# Patient Record
Sex: Male | Born: 1945 | Race: White | Hispanic: No | Marital: Married | Smoking: Current every day smoker
Health system: Southern US, Community
[De-identification: ages and names within clinical notes are randomized; demographics above are authoritative.]

## PROBLEM LIST (undated history)

## (undated) DIAGNOSIS — I4510 Unspecified right bundle-branch block: Secondary | ICD-10-CM

## (undated) DIAGNOSIS — E785 Hyperlipidemia, unspecified: Secondary | ICD-10-CM

## (undated) DIAGNOSIS — I200 Unstable angina: Secondary | ICD-10-CM

## (undated) DIAGNOSIS — I10 Essential (primary) hypertension: Secondary | ICD-10-CM

## (undated) DIAGNOSIS — I6529 Occlusion and stenosis of unspecified carotid artery: Secondary | ICD-10-CM

## (undated) DIAGNOSIS — J449 Chronic obstructive pulmonary disease, unspecified: Secondary | ICD-10-CM

## (undated) HISTORY — DX: Occlusion and stenosis of unspecified carotid artery: I65.29

## (undated) HISTORY — DX: Chronic obstructive pulmonary disease, unspecified: J44.9

## (undated) HISTORY — DX: Hyperlipidemia, unspecified: E78.5

## (undated) HISTORY — PX: HERNIA REPAIR: SHX51

## (undated) HISTORY — DX: Essential (primary) hypertension: I10

---

## 1980-02-16 HISTORY — PX: SPINE SURGERY: SHX786

## 2006-02-15 HISTORY — PX: INGUINAL HERNIA REPAIR: SUR1180

## 2007-08-10 ENCOUNTER — Ambulatory Visit: Payer: Self-pay | Admitting: *Deleted

## 2008-08-09 ENCOUNTER — Ambulatory Visit: Payer: Self-pay | Admitting: Gastroenterology

## 2008-08-30 ENCOUNTER — Ambulatory Visit: Payer: Self-pay | Admitting: Gastroenterology

## 2008-10-22 ENCOUNTER — Ambulatory Visit (HOSPITAL_COMMUNITY): Admission: RE | Admit: 2008-10-22 | Discharge: 2008-10-22 | Payer: Self-pay | Admitting: Surgery

## 2008-10-31 ENCOUNTER — Ambulatory Visit: Payer: Self-pay | Admitting: Vascular Surgery

## 2009-05-08 ENCOUNTER — Ambulatory Visit: Payer: Self-pay | Admitting: Vascular Surgery

## 2009-11-07 ENCOUNTER — Ambulatory Visit: Payer: Self-pay | Admitting: Vascular Surgery

## 2010-05-22 LAB — DIFFERENTIAL
Basophils Absolute: 0 K/uL (ref 0.0–0.1)
Basophils Relative: 1 % (ref 0–1)
Eosinophils Absolute: 0.1 K/uL (ref 0.0–0.7)
Eosinophils Relative: 1 % (ref 0–5)
Lymphocytes Relative: 27 % (ref 12–46)
Lymphs Abs: 2.2 K/uL (ref 0.7–4.0)
Monocytes Absolute: 0.8 K/uL (ref 0.1–1.0)
Monocytes Relative: 10 % (ref 3–12)
Neutro Abs: 5.1 K/uL (ref 1.7–7.7)
Neutrophils Relative %: 61 % (ref 43–77)

## 2010-05-22 LAB — BASIC METABOLIC PANEL WITH GFR
BUN: 19 mg/dL (ref 6–23)
CO2: 28 meq/L (ref 19–32)
Calcium: 10.1 mg/dL (ref 8.4–10.5)
Chloride: 106 meq/L (ref 96–112)
Creatinine, Ser: 1.73 mg/dL — ABNORMAL HIGH (ref 0.4–1.5)
GFR calc non Af Amer: 40 mL/min — ABNORMAL LOW
Glucose, Bld: 111 mg/dL — ABNORMAL HIGH (ref 70–99)
Potassium: 5.8 meq/L — ABNORMAL HIGH (ref 3.5–5.1)
Sodium: 142 meq/L (ref 135–145)

## 2010-05-22 LAB — CBC
HCT: 40.7 % (ref 39.0–52.0)
Hemoglobin: 14 g/dL (ref 13.0–17.0)
MCHC: 34.5 g/dL (ref 30.0–36.0)
MCV: 98.5 fL (ref 78.0–100.0)
Platelets: 163 K/uL (ref 150–400)
RBC: 4.13 MIL/uL — ABNORMAL LOW (ref 4.22–5.81)
RDW: 13.1 % (ref 11.5–15.5)
WBC: 8.2 K/uL (ref 4.0–10.5)

## 2010-06-30 NOTE — Procedures (Signed)
CAROTID DUPLEX EXAM   INDICATION:  Followup of known carotid artery disease with 40%-59%  stenosis in the left ICA on previous exam of 05/08/2009.   HISTORY:  Diabetes:  No.  Cardiac:  No.  Hypertension:  Yes.  Smoking:  Yes.  Previous Surgery:  No.  CV History:  Amaurosis Fugax No, Paresthesias No, Hemiparesis No                                       RIGHT             LEFT  Brachial systolic pressure:         130               136  Brachial Doppler waveforms:         Triphasic         Triphasic  Vertebral direction of flow:        Antegrade         Antegrade  DUPLEX VELOCITIES (cm/sec)  CCA peak systolic                   118               118  ECA peak systolic                   124               122  ICA peak systolic                   81                103  ICA end diastolic                   20                24  PLAQUE MORPHOLOGY:                                    Calcific  PLAQUE AMOUNT:                      None              Mild  PLAQUE LOCATION:                                      Bifurcation   IMPRESSION:  1. No hemodynamically significant right internal carotid artery      stenosis, <40%.  2. No hemodynamically significant left internal carotid artery      stenosis, <40%.  3. Stable velocities in internal carotid arteries compared to previous      study of 05/08/2009.   ___________________________________________  Di Kindle. Edilia Bo, M.D.   RS/MEDQ  D:  11/07/2009  T:  11/08/2009  Job:  [id number]

## 2010-06-30 NOTE — Procedures (Signed)
CAROTID DUPLEX EXAM   INDICATION:  Follow-up carotid artery disease.   HISTORY:  Diabetes:  No.  Cardiac:  No.  Hypertension:  Yes.  Smoking:  Yes.  Previous Surgery:  No.  CV History:  No.  Amaurosis Fugax No, Paresthesias No, Hemiparesis No                                       RIGHT             LEFT  Brachial systolic pressure:         142               144  Brachial Doppler waveforms:         WNL               WNL  Vertebral direction of flow:        Antegrade         Antegrade  DUPLEX VELOCITIES (cm/sec)  CCA peak systolic                   153               180  ECA peak systolic                   194               190  ICA peak systolic                   119               161  ICA end diastolic                   32                44  PLAQUE MORPHOLOGY:                  Calcific          Calcific  PLAQUE AMOUNT:                      Mild/moderate     Moderate  PLAQUE LOCATION:                    ECA/bifurcation  ICA/ECA/bifurcation   IMPRESSION:  1. Right internal carotid artery shows evidence of 40% to 59% stenosis      (low end of range) with minimal plaque visualized in the internal      carotid artery.  2. Left internal carotid artery shows evidence of 40% to 59% stenosis.  3. Bilateral external carotid artery stenosis.   ___________________________________________  Di Kindle. Edilia Bo, M.D.   AS/MEDQ  D:  10/31/2008  T:  10/31/2008  Job:  [id number]

## 2010-06-30 NOTE — Assessment & Plan Note (Signed)
OFFICE VISIT   Alex Mullins, Alex Mullins  DOB:  Aug 04, 1945                                       10/31/2008  CHART#:[id number]   I saw the patient in the office today for continued follow-up of his  carotid disease.  He had been seen in consultation by Dr. Madilyn Fireman in June  2009 with bilateral carotid disease.  At that time he had moderate  asymptomatic left internal carotid artery stenosis and Dr. Madilyn Fireman simply  discussed tobacco cessation with him and he has been on aspirin.  He was  then lost to follow-up from our group but Dr. Tania Ade set him up for a  follow-up duplex scan.  Since he was seen last he denies any history of  stroke, TIAs, expressive or receptive aphasia or amaurosis fugax.   He does admit to bilateral lower extremity claudication which really had  not been apparent to him until he had a stress test approximately 3  weeks ago and had to stop because of pain in his legs.  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  He  denies any history of rest pain, although he does get cramps in his feet  at night.  He has had no history of nonhealing ulcers.  His symptoms in  his calves are brought on by ambulation and relieved with rest..   PAST MEDICAL HISTORY:  Is significant for hypertension,  hypercholesterolemia, COPD.  He denies any history of diabetes, previous  myocardial infarction or history of congestive heart failure.   FAMILY HISTORY:  He is unaware of any history of premature  cardiovascular disease.   SOCIAL HISTORY:  He is married and he has 3 children.  He works as a  [employer], he had smoked 5-6/2 packs per day of cigarettes but has  cut back to 1-1/2 packs per day.  Review of systems, medications are  documented on the medical history form in his chart.   PHYSICAL EXAMINATION:  This is a pleasant 65 year old gentleman who  appears his stated age.  Blood pressure is 148/67, heart rate is 69.  HEENT:   Is unremarkable.  I do not detect any carotid bruits.  Lungs:  Are clear bilaterally to auscultation.  On cardiac exam, he has a  regular rate and rhythm.  Abdomen:  Soft and nontender, I cannot palpate  an aneurysm.  He has palpable femoral pulses, I cannot palpate pedal  pulses, both feet appear adequately perfused without ischemic ulcers.  He has no significant lower extremity swelling.  Neurologic Exam:  Is  nonfocal.   Doppler study in our office today shows biphasic Doppler signals in the  right foot with an ABI of 92%.  On the left side he has monophasic  signals with an ABI of 58%.   His carotid duplex scan shows bilateral 40% to 59% carotid stenoses.  Vertebral arteries are patent with normally-directed flow.   With respect to his peripheral vascular disease, I have encouraged him  to quit smoking and he is working on this.  We have also discussed the  importance of a structured walking program.  With respect to his carotid  disease, he understands we would generally not consider carotid  endarterectomy in an asymptomatic patient unless the stenosis progressed  to greater than 80%.  I plan  on seeing him back in 6 months with a  follow-up duplex scan.  In the meantime, he knows to continue taking his  aspirin.  He knows to call sooner if he has problems.   Di Kindle. Edilia Bo, M.D.  Electronically Signed   CSD/MEDQ  D:  10/31/2008  T:  11/01/2008  Job:  [id number]   cc:   Belva Agee, Dr.

## 2010-06-30 NOTE — Consult Note (Signed)
VASCULAR SURGERY CONSULTATION   Alex Mullins, Alex Mullins  DOB:  10-09-1945                                       08/10/2007  UJWJX#:[id number]   REFERRING PHYSICIAN:  Belva Agee, MD   REASON FOR CONSULTATION:  Bilateral carotid occlusive disease.   HISTORY:  The patient is a 65 year old gentleman recently evaluated by  Dr. Tania Ade at [facility].  He was complaining of  some ringing in his ears at that time.  He was also noted to have  possible bilateral carotid bruits.  He was therefore sent for carotid  Doppler evaluation.  This was carried out at [facility].  He has  a 50-69% proximal left internal carotid artery stenosis and a less than  50% proximal right internal carotid artery stenosis.  Bilateral carotid  plaque is noted.  Antegrade bilateral vertebral flow.   The patient denies a history of stroke.  No sensory, motor or visual  deficit.  He does have ringing in his ears.  No unsteadiness of gait.  Denies dizziness.  No syncope.   PAST MEDICAL HISTORY:  1. Hypertension.  2. Hyperlipidemia.  3. COPD.  4. Vitamin D deficiency.  5. Tobacco abuse.   MEDICATIONS:  1. Simvastatin 40 mg daily.  2. Fish oil 1000 international units t.i.d.  3. Vitamin D 5000 international units weekly.  4. Benicar 40/25 daily.   FAMILY HISTORY:  Mother died age 27, no history of heart disease or  stroke.  Father died age 61 with a history of heart disease, died of an  MI.   SOCIAL HISTORY:  The patient is married, three children.  Works as a  [employer].  Smokes 2-1/2 packs of cigarettes daily.  Has an  occasional alcoholic beverage.   REVIEW OF SYSTEMS:  Refer to patient encounter form.  The patient notes  some cramps in his legs at night.  Pain and discomfort in his legs with  ambulation.  Other questions otherwise negative.   PHYSICAL EXAMINATION:  General:  Generally a well-appearing 65 year old  male.  Alert and oriented.  In no acute  distress.  Vital signs:  BP  130/76, pulse is 70 per minute.  Respirations are 16 per minute.  Neck:  Supple.  No thyromegaly or adenopathy.  Chest:  Equal air entry  bilaterally without rales or rhonchi.  Cardiovascular:  Normal heart  sounds without murmurs.  No carotid bruits.  Regular rate and rhythm.  No gallops or rubs.  Abdomen:  Soft, nontender.  No masses or  organomegaly.  Neurological:  Cranial nerves intact.  Strength equal  bilaterally.  1+ reflexes bilaterally.   IMPRESSION:  1. Moderate asymptomatic left internal carotid artery stenosis.  2. Hypertension.  3. Hyperlipidemia.  4. Chronic obstructive pulmonary disease.  5. Tobacco abuse.   RECOMMENDATIONS:  The patient counseled regarding smoking cessation.  Begin aspirin 325 mg daily.  Follow up in 6 months with a carotid  Doppler evaluation.   Balinda Quails, M.D.  Electronically Signed  PGH/MEDQ  D:  08/10/2007  T:  08/11/2007  Job:  [id number]

## 2010-06-30 NOTE — Procedures (Signed)
CAROTID DUPLEX EXAM   INDICATION:  Follow-up carotid artery disease.   HISTORY:  Diabetes:  No  Cardiac:  No  Hypertension:  Yes  Smoking:  Yes  Previous Surgery:  No  CV History:  No  Amaurosis Fugax No, Paresthesias No, Hemiparesis No                                       RIGHT             LEFT  Brachial systolic pressure:         126               125  Brachial Doppler waveforms:         WNL               WNL  Vertebral direction of flow:        Antegrade         Antegrade  DUPLEX VELOCITIES (cm/sec)  CCA peak systolic                   132               118  ECA peak systolic                   137               128  ICA peak systolic                   88                111  ICA end diastolic                   23                34  PLAQUE MORPHOLOGY:                  Calcific  Calcific/heterogeneous  PLAQUE AMOUNT:                      Mild              Mild  PLAQUE LOCATION:                    ICA, ECA          CCA, ICA   IMPRESSION:  1. Right internal carotid artery suggests 20%-39% stenosis which is      slightly lower than previous study.  2. Left internal carotid artery suggests 40%-59% stenosis.  3. Antegrade flow in bilateral vertebrals.   ___________________________________________  Di Kindle. Edilia Bo, M.D.   CB/MEDQ  D:  05/09/2009  T:  05/09/2009  Job:  [id number]

## 2010-10-07 ENCOUNTER — Encounter: Payer: Self-pay | Admitting: Vascular Surgery

## 2010-11-10 ENCOUNTER — Encounter: Payer: Self-pay | Admitting: Vascular Surgery

## 2010-11-11 ENCOUNTER — Encounter: Payer: Self-pay | Admitting: Vascular Surgery

## 2010-11-11 ENCOUNTER — Ambulatory Visit (INDEPENDENT_AMBULATORY_CARE_PROVIDER_SITE_OTHER): Payer: Medicare Other | Admitting: Vascular Surgery

## 2010-11-11 ENCOUNTER — Other Ambulatory Visit (INDEPENDENT_AMBULATORY_CARE_PROVIDER_SITE_OTHER): Payer: Medicare Other | Admitting: *Deleted

## 2010-11-11 VITALS — BP 165/69 | HR 44 | Resp 16 | Ht 72.0 in | Wt 180.3 lb

## 2010-11-11 DIAGNOSIS — I6529 Occlusion and stenosis of unspecified carotid artery: Secondary | ICD-10-CM

## 2010-11-11 NOTE — Progress Notes (Signed)
CC: followup of carotid disease  HPI: Alex Mullins is a 65 y.o. male who I last saw in September of 2010 with bilateral 40-59% carotid stenoses. 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 He denies any history of stroke, TIAs, expressive or receptive aphasia, or amaurosis fugax. He has a history of hypertension and hyperlipidemia both of which are stable on his current medications. He also has a history of COPD but has had no recent exacerbations.  Past Medical History  Diagnosis Date  . COPD (chronic obstructive pulmonary disease)   . Hyperlipidemia   . Hypertension   . Vitamin D deficiency   . Carotid artery occlusion     FAMILY HISTORY: Family History  Problem Relation Age of Onset  . Heart disease Father   . Heart attack Father     SOCIAL HISTORY: History  Substance Use Topics  . Smoking status: Current Everyday Smoker -- 2.5 packs/day for 50 years    Types: Cigarettes  . Smokeless tobacco: Not on file  . Alcohol Use: No     occasionally    No Known Allergies  Current Outpatient Prescriptions  Medication Sig Dispense Refill  . Cholecalciferol (VITAMIN D3) 5000 UNITS CAPS Take 5,000 Units by mouth once a week.        . fish oil-omega-3 fatty acids 1000 MG capsule Take 2 g by mouth 3 (three) times daily.        Marland Kitchen olmesartan-hydrochlorothiazide (BENICAR HCT) 40-25 MG per tablet Take 1 tablet by mouth daily.        . simvastatin (ZOCOR) 40 MG tablet Take 40 mg by mouth at bedtime.          REVIEW OF SYSTEMS: CARDIOVASCULAR:  [ ]  chest pain   [ ]  chest pressure   [ ]  palpitations   [ ]  orthopnea   [ ]  dyspnea on exertion   [ ]  claudication   [ ]  rest pain   [ ]  DVT   [ ]  phlebitis PULMONARY:   [ ]  productive cough   [ ]  asthma   [ ]  wheezing NEUROLOGIC:   [ ]  weakness  [ ]  paresthesias  [ ]  aphasia  [ ]  amaurosis  [ ]  dizziness HEMATOLOGIC:   [ ]  bleeding  problems   [ ]  clotting disorders MUSCULOSKELETAL:  [ ]  joint pain   [ ]  joint swelling GASTROINTESTINAL: [ ]   blood in stool  [ ]   hematemesis GENITOURINARY:  [ ]   dysuria  [ ]   hematuria PSYCHIATRIC:  [ ]  history of major depression INTEGUMENTARY:  [ ]  rashes  [ ]  ulcers CONSTITUTIONAL:  [ ]  fever   [ ]  chills  PHYSICAL EXAM: Filed Vitals:   11/11/10 1059 11/11/10 1100  BP: 170/69 165/69  Pulse: 44   Resp: 16   Height: 6' (1.829 m)   Weight: 180 lb 4.8 oz (81.784 kg)    Body mass index is 24.45 kg/(m^2).  GENERAL: The patient appears their stated age. The vital signs are documented above. CARDIOVASCULAR: There is a regular rate and rhythm without significant murmur appreciated. I do not detect any carotid bruits. He has palpable femoral pulses and warm well-perfused feet. PULMONARY: There is good air exchange bilaterally without wheezing or rales. ABDOMEN: Soft and non-tender with normal pitched bowel sounds. I do not palpate an abdominal aortic aneurysm. MUSCULOSKELETAL: There are no major deformities or  cyanosis. NEUROLOGIC: No focal weakness or paresthesias are detected. SKIN: There are no ulcers or rashes noted. PSYCHIATRIC: The patient has a normal affect.  DATA: I have independently interpreted his carotid duplex scan which shows no significant carotid stenosis on either side. The velocities have improved compared to the study in September of 2010.   MEDICAL ISSUES: I reassured him that he has minimal carotid disease. Have discussed the importance of tobacco cessation. I have ordered a followup carotid duplex scan in 18 months and I will see him back at that time. He knows to call sooner if he has problems.

## 2010-11-19 NOTE — Procedures (Unsigned)
CAROTID DUPLEX EXAM  INDICATION:  Follow up carotid disease.  HISTORY: Diabetes:  No. Cardiac:  No. Hypertension:  Yes. Smoking:  Yes. Previous Surgery:  No. CV History:  Currently asymptomatic. Amaurosis Fugax No, Paresthesias No, Hemiparesis No.                                      RIGHT             LEFT Brachial systolic pressure:         133               130 Brachial Doppler waveforms:         Normal            Normal Vertebral direction of flow:        Antegrade         Antegrade DUPLEX VELOCITIES (cm/sec) CCA peak systolic                   103               108 ECA peak systolic                   126               136 ICA peak systolic                   77                96 ICA end diastolic                   19                22 PLAQUE MORPHOLOGY:                                    Calcific PLAQUE AMOUNT:                      None              Mild PLAQUE LOCATION:                                      Bifurcation   IMPRESSION: 1. No hemodynamically significant stenosis of the right or left     internal carotid artery. 2. Stable in comparison to the previous examination.        ___________________________________________ Di Kindle. Edilia Bo, M.D.  EM/MEDQ  D:  11/12/2010  T:  11/12/2010  Job:  [id number]

## 2012-05-10 ENCOUNTER — Other Ambulatory Visit: Payer: Medicare Other

## 2012-05-10 ENCOUNTER — Ambulatory Visit: Payer: Medicare Other | Admitting: Neurosurgery

## 2012-06-13 ENCOUNTER — Encounter: Payer: Self-pay | Admitting: Vascular Surgery

## 2012-06-14 ENCOUNTER — Ambulatory Visit (INDEPENDENT_AMBULATORY_CARE_PROVIDER_SITE_OTHER): Payer: Medicare Other | Admitting: Vascular Surgery

## 2012-06-14 ENCOUNTER — Encounter: Payer: Self-pay | Admitting: Vascular Surgery

## 2012-06-14 ENCOUNTER — Other Ambulatory Visit (INDEPENDENT_AMBULATORY_CARE_PROVIDER_SITE_OTHER): Payer: Medicare Other | Admitting: *Deleted

## 2012-06-14 DIAGNOSIS — I6529 Occlusion and stenosis of unspecified carotid artery: Secondary | ICD-10-CM

## 2012-06-14 NOTE — Addendum Note (Signed)
Addended by: Adria Dill L on: 06/14/2012 02:52 PM   Modules accepted: Orders

## 2012-06-14 NOTE — Progress Notes (Signed)
[facility] [place]  Established Carotid Patient  History of Present Illness  Alex Mullins is a 67 y.o. (September 17, 1945) male who presents with no chief complaint.  He is here for his annul follow visit.  Previous carotid studies demonstrated: RICA <40% stenosis, LICA <40% stenosis.  Patient has no history of TIA or stroke symptom.  The patient has not had amaurosis fugax or monocular blindness.  The patient has not had facial drooping or hemiplegia.  The patient has not had receptive or expressive aphasia.    Past Medical History, Past Surgical History includes cataract surgery bilaterally, Social History, Family History, Medications, Allergies, and Review of Systems are unchanged from previous evaluation on 11/11/2010.  Physical Examination  Filed Vitals:   06/14/12 1205 06/14/12 1209  BP: 173/87 178/83  Pulse: 73 68  Resp: 16   Height: 6' (1.829 m)   Weight: 197 lb (89.359 kg)   SpO2: 98%    Body mass index is 26.71 kg/(m^2).  General: A&O x 3, WDWN, neg. Cachectic / Ill appear / Somulent  Eyes: PERRLA, EOMI  Pulmonary: Sym exp, good air movt, CTAB, no rales, + rhonchi right upper and lower, & no wheezing,   Cardiac: RRR, Nl S1, S2, no Murmurs, rubs or gallops, neg S3/S4, Irregularly, irregular rhythm and rate  Vascular: Vessel Right Left  Radial Palpable Palpable  Ulnar Palpable Palpable  Brachial Palpable Palpable  Carotid Palpable, without bruit Palpable, without bruit  Aorta Not palpable N/A  Femoral Palpable Palpable  Popliteal Not palpable Not palpable  PT  Palpable  Palpable  DP  Palpable  Palpable   Gastrointestinal: soft, NTND, + BS Musculoskeletal: M/S 5/5 throughout, Extremities without ischemic changes.  Neurologic: CN 2-12 intact, Pain and light touch intact in extremities, Motor exam as listed above  Non-Invasive Vascular Imaging  CAROTID DUPLEX (Date: 06/14/2012):   R ICA stenosis: <40%   L ICA stenosis: <40%    Medical  Decision Making  Alex Mullins is a 67 y.o. male who presents with: bilateral ICA stenosis.   Based on the patient's vascular studies and examination, I have offered the patient: 18 month follow up appt.. I discussed in depth with the patient the nature of atherosclerosis, and emphasized the importance of maximal medical management including to stop smoking.  The patient wasseen by Dr. Edilia Bo.  Thomasena Edis EMMA [facility] PA-C [facility] [place] Office: [phone]   06/14/2012, 12:25 PM  Agree with above. Patient was evaluated and examined and I agree with plans for follow up in 18 months with a carotid duplex scan. We've also discussed the importance of tobacco cessation. He knows to call sooner if he has problems. Also instructed in the he should be taking aspirin daily.  Waverly Ferrari, MD, FACS Beeper [phone] 06/14/2012

## 2013-04-10 ENCOUNTER — Other Ambulatory Visit: Payer: Self-pay | Admitting: Vascular Surgery

## 2013-04-10 DIAGNOSIS — I6529 Occlusion and stenosis of unspecified carotid artery: Secondary | ICD-10-CM

## 2013-12-18 ENCOUNTER — Encounter: Payer: Self-pay | Admitting: Vascular Surgery

## 2013-12-19 ENCOUNTER — Ambulatory Visit (HOSPITAL_COMMUNITY)
Admission: RE | Admit: 2013-12-19 | Discharge: 2013-12-19 | Disposition: A | Discharge status: Home / Self Care | Payer: Medicare Other | Source: Ambulatory Visit | Attending: Family | Admitting: Family

## 2013-12-19 ENCOUNTER — Ambulatory Visit (INDEPENDENT_AMBULATORY_CARE_PROVIDER_SITE_OTHER): Payer: Medicare Other | Admitting: Family

## 2013-12-19 ENCOUNTER — Encounter: Payer: Self-pay | Admitting: Family

## 2013-12-19 VITALS — BP 173/85 | HR 69 | Resp 20 | Ht 72.5 in | Wt 203.4 lb

## 2013-12-19 DIAGNOSIS — I6523 Occlusion and stenosis of bilateral carotid arteries: Secondary | ICD-10-CM | POA: Diagnosis not present

## 2013-12-19 DIAGNOSIS — I739 Peripheral vascular disease, unspecified: Secondary | ICD-10-CM

## 2013-12-19 DIAGNOSIS — I6529 Occlusion and stenosis of unspecified carotid artery: Secondary | ICD-10-CM | POA: Insufficient documentation

## 2013-12-19 NOTE — Patient Instructions (Addendum)
Stroke Prevention Some medical conditions and behaviors are associated with an increased chance of having a stroke. You may prevent a stroke by making healthy choices and managing medical conditions. HOW CAN I REDUCE MY RISK OF HAVING A STROKE?   Stay physically active. Get at least 30 minutes of activity on most or all days.  Do not smoke. It may also be helpful to avoid exposure to secondhand smoke.  Limit alcohol use. Moderate alcohol use is considered to be:  No more than 2 drinks per day for men.  No more than 1 drink per day for nonpregnant women.  Eat healthy foods. This involves:  Eating 5 or more servings of fruits and vegetables a day.  Making dietary changes that address high blood pressure (hypertension), high cholesterol, diabetes, or obesity.  Manage your cholesterol levels.  Making food choices that are high in fiber and low in saturated fat, trans fat, and cholesterol may control cholesterol levels.  Take any prescribed medicines to control cholesterol as directed by your health care provider.  Manage your diabetes.  Controlling your carbohydrate and sugar intake is recommended to manage diabetes.  Take any prescribed medicines to control diabetes as directed by your health care provider.  Control your hypertension.  Making food choices that are low in salt (sodium), saturated fat, trans fat, and cholesterol is recommended to manage hypertension.  Take any prescribed medicines to control hypertension as directed by your health care provider.  Maintain a healthy weight.  Reducing calorie intake and making food choices that are low in sodium, saturated fat, trans fat, and cholesterol are recommended to manage weight.  Stop drug abuse.  Avoid taking birth control pills.  Talk to your health care provider about the risks of taking birth control pills if you are over 35 years old, smoke, get migraines, or have ever had a blood clot.  Get evaluated for sleep  disorders (sleep apnea).  Talk to your health care provider about getting a sleep evaluation if you snore a lot or have excessive sleepiness.  Take medicines only as directed by your health care provider.  For some people, aspirin or blood thinners (anticoagulants) are helpful in reducing the risk of forming abnormal blood clots that can lead to stroke. If you have the irregular heart rhythm of atrial fibrillation, you should be on a blood thinner unless there is a good reason you cannot take them.  Understand all your medicine instructions.  Make sure that other conditions (such as anemia or atherosclerosis) are addressed. SEEK IMMEDIATE MEDICAL CARE IF:   You have sudden weakness or numbness of the face, arm, or leg, especially on one side of the body.  Your face or eyelid droops to one side.  You have sudden confusion.  You have trouble speaking (aphasia) or understanding.  You have sudden trouble seeing in one or both eyes.  You have sudden trouble walking.  You have dizziness.  You have a loss of balance or coordination.  You have a sudden, severe headache with no known cause.  You have new chest pain or an irregular heartbeat. Any of these symptoms may represent a serious problem that is an emergency. Do not wait to see if the symptoms will go away. Get medical help at once. Call your local emergency services (911 in [place] Do not drive yourself to the hospital. Document Released: 03/11/2004 Document Revised: 06/18/2013 Document Reviewed: 08/04/2012 ExitCare Patient Information 2015 [employer]. This information is not intended to replace advice given   to you by your health care provider. Make sure you discuss any questions you have with your health care provider.   Smoking Cessation Quitting smoking is important to your health and has many advantages. However, it is not always easy to quit since nicotine is a very addictive drug. Oftentimes, people try 3 times or more  before being able to quit. This document explains the best ways for you to prepare to quit smoking. Quitting takes hard work and a lot of effort, but you can do it. ADVANTAGES OF QUITTING SMOKING  You will live longer, feel better, and live better.  Your body will feel the impact of quitting smoking almost immediately.  Within 20 minutes, blood pressure decreases. Your pulse returns to its normal level.  After 8 hours, carbon monoxide levels in the blood return to normal. Your oxygen level increases.  After 24 hours, the chance of having a heart attack starts to decrease. Your breath, hair, and body stop smelling like smoke.  After 48 hours, damaged nerve endings begin to recover. Your sense of taste and smell improve.  After 72 hours, the body is virtually free of nicotine. Your bronchial tubes relax and breathing becomes easier.  After 2 to 12 weeks, lungs can hold more air. Exercise becomes easier and circulation improves.  The risk of having a heart attack, stroke, cancer, or lung disease is greatly reduced.  After 1 year, the risk of coronary heart disease is cut in half.  After 5 years, the risk of stroke falls to the same as a nonsmoker.  After 10 years, the risk of lung cancer is cut in half and the risk of other cancers decreases significantly.  After 15 years, the risk of coronary heart disease drops, usually to the level of a nonsmoker.  If you are pregnant, quitting smoking will improve your chances of having a healthy baby.  The people you live with, especially any children, will be healthier.  You will have extra money to spend on things other than cigarettes. QUESTIONS TO THINK ABOUT BEFORE ATTEMPTING TO QUIT You may want to talk about your answers with your health care provider.  Why do you want to quit?  If you tried to quit in the past, what helped and what did not?  What will be the most difficult situations for you after you quit? How will you plan to  handle them?  Who can help you through the tough times? Your family? Friends? A health care provider?  What pleasures do you get from smoking? What ways can you still get pleasure if you quit? Here are some questions to ask your health care provider:  How can you help me to be successful at quitting?  What medicine do you think would be best for me and how should I take it?  What should I do if I need more help?  What is smoking withdrawal like? How can I get information on withdrawal? GET READY  Set a quit date.  Change your environment by getting rid of all cigarettes, ashtrays, matches, and lighters in your home, car, or work. Do not let people smoke in your home.  Review your past attempts to quit. Think about what worked and what did not. GET SUPPORT AND ENCOURAGEMENT You have a better chance of being successful if you have help. You can get support in many ways.  Tell your family, friends, and coworkers that you are going to quit and need their support. Ask them not   to smoke around you.  Get individual, group, or telephone counseling and support. Programs are available at local hospitals and health centers. Call your local health department for information about programs in your area.  Spiritual beliefs and practices may help some smokers quit.  Download a "quit meter" on your computer to keep track of quit statistics, such as how long you have gone without smoking, cigarettes not smoked, and money saved.  Get a self-help book about quitting smoking and staying off tobacco. LEARN NEW SKILLS AND BEHAVIORS  Distract yourself from urges to smoke. Talk to someone, go for a walk, or occupy your time with a task.  Change your normal routine. Take a different route to work. Drink tea instead of coffee. Eat breakfast in a different place.  Reduce your stress. Take a hot bath, exercise, or read a book.  Plan something enjoyable to do every day. Reward yourself for not  smoking.  Explore interactive web-based programs that specialize in helping you quit. GET MEDICINE AND USE IT CORRECTLY Medicines can help you stop smoking and decrease the urge to smoke. Combining medicine with the above behavioral methods and support can greatly increase your chances of successfully quitting smoking.  Nicotine replacement therapy helps deliver nicotine to your body without the negative effects and risks of smoking. Nicotine replacement therapy includes nicotine gum, lozenges, inhalers, nasal sprays, and skin patches. Some may be available over-the-counter and others require a prescription.  Antidepressant medicine helps people abstain from smoking, but how this works is unknown. This medicine is available by prescription.  Nicotinic receptor partial agonist medicine simulates the effect of nicotine in your brain. This medicine is available by prescription. Ask your health care provider for advice about which medicines to use and how to use them based on your health history. Your health care provider will tell you what side effects to look out for if you choose to be on a medicine or therapy. Carefully read the information on the package. Do not use any other product containing nicotine while using a nicotine replacement product.  RELAPSE OR DIFFICULT SITUATIONS Most relapses occur within the first 3 months after quitting. Do not be discouraged if you start smoking again. Remember, most people try several times before finally quitting. You may have symptoms of withdrawal because your body is used to nicotine. You may crave cigarettes, be irritable, feel very hungry, cough often, get headaches, or have difficulty concentrating. The withdrawal symptoms are only temporary. They are strongest when you first quit, but they will go away within 10-14 days. To reduce the chances of relapse, try to:  Avoid drinking alcohol. Drinking lowers your chances of successfully quitting.  Reduce the  amount of caffeine you consume. Once you quit smoking, the amount of caffeine in your body increases and can give you symptoms, such as a rapid heartbeat, sweating, and anxiety.  Avoid smokers because they can make you want to smoke.  Do not let weight gain distract you. Many smokers will gain weight when they quit, usually less than 10 pounds. Eat a healthy diet and stay active. You can always lose the weight gained after you quit.  Find ways to improve your mood other than smoking. FOR MORE INFORMATION  www.smokefree.gov  Document Released: 01/26/2001 Document Revised: 06/18/2013 Document Reviewed: 05/13/2011 ExitCare Patient Information 2015 [employer]. This information is not intended to replace advice given to you by your health care provider. Make sure you discuss any questions you have with your health care   provider.   Peripheral Vascular Disease Peripheral Vascular Disease (PVD), also called Peripheral Arterial Disease (PAD), is a circulation problem caused by cholesterol (atherosclerotic plaque) deposits in the arteries. PVD commonly occurs in the lower extremities (legs) but it can occur in other areas of the body, such as your arms. The cholesterol buildup in the arteries reduces blood flow which can cause pain and other serious problems. The presence of PVD can place a person at risk for Coronary Artery Disease (CAD).  CAUSES  Causes of PVD can be many. It is usually associated with more than one risk factor such as:   High Cholesterol.  Smoking.  Diabetes.  Lack of exercise or inactivity.  High blood pressure (hypertension).  Obesity.  Family history. SYMPTOMS   When the lower extremities are affected, patients with PVD may experience:  Leg pain with exertion or physical activity. This is called INTERMITTENT CLAUDICATION. This may present as cramping or numbness with physical activity. The location of the pain is associated with the level of blockage. For example,  blockage at the abdominal level (distal abdominal aorta) may result in buttock or hip pain. Lower leg arterial blockage may result in calf pain.  As PVD becomes more severe, pain can develop with less physical activity.  In people with severe PVD, leg pain may occur at rest.  Other PVD signs and symptoms:  Leg numbness or weakness.  Coldness in the affected leg or foot, especially when compared to the other leg.  A change in leg color.  Patients with significant PVD are more prone to ulcers or sores on toes, feet or legs. These may take longer to heal or may reoccur. The ulcers or sores can become infected.  If signs and symptoms of PVD are ignored, gangrene may occur. This can result in the loss of toes or loss of an entire limb.  Not all leg pain is related to PVD. Other medical conditions can cause leg pain such as:  Blood clots (embolism) or Deep Vein Thrombosis.  Inflammation of the blood vessels (vasculitis).  Spinal stenosis. DIAGNOSIS  Diagnosis of PVD can involve several different types of tests. These can include:  Pulse Volume Recording Method (PVR). This test is simple, painless and does not involve the use of X-rays. PVR involves measuring and comparing the blood pressure in the arms and legs. An ABI (Ankle-Brachial Index) is calculated. The normal ratio of blood pressures is 1. As this number becomes smaller, it indicates more severe disease.  < 0.95 - indicates significant narrowing in one or more leg vessels.  <0.8 - there will usually be pain in the foot, leg or buttock with exercise.  <0.4 - will usually have pain in the legs at rest.  <0.25 - usually indicates limb threatening PVD.  Doppler detection of pulses in the legs. This test is painless and checks to see if you have a pulses in your legs/feet.  A dye or contrast material (a substance that highlights the blood vessels so they show up on x-ray) may be given to help your caregiver better see the  arteries for the following tests. The dye is eliminated from your body by the kidney's. Your caregiver may order blood work to check your kidney function and other laboratory values before the following tests are performed:  Magnetic Resonance Angiography (MRA). An MRA is a picture study of the blood vessels and arteries. The MRA machine uses a large magnet to produce images of the blood vessels.  Computed Tomography  Angiography (CTA). A CTA is a specialized x-ray that looks at how the blood flows in your blood vessels. An IV may be inserted into your arm so contrast dye can be injected.  Angiogram. Is a procedure that uses x-rays to look at your blood vessels. This procedure is minimally invasive, meaning a small incision (cut) is made in your groin. A small tube (catheter) is then inserted into the artery of your groin. The catheter is guided to the blood vessel or artery your caregiver wants to examine. Contrast dye is injected into the catheter. X-rays are then taken of the blood vessel or artery. After the images are obtained, the catheter is taken out. TREATMENT  Treatment of PVD involves many interventions which may include:  Lifestyle changes:  Quitting smoking.  Exercise.  Following a low fat, low cholesterol diet.  Control of diabetes.  Foot care is very important to the PVD patient. Good foot care can help prevent infection.  Medication:  Cholesterol-lowering medicine.  Blood pressure medicine.  Anti-platelet drugs.  Certain medicines may reduce symptoms of Intermittent Claudication.  Interventional/Surgical options:  Angioplasty. An Angioplasty is a procedure that inflates a balloon in the blocked artery. This opens the blocked artery to improve blood flow.  Stent Implant. A wire mesh tube (stent) is placed in the artery. The stent expands and stays in place, allowing the artery to remain open.  Peripheral Bypass Surgery. This is a surgical procedure that reroutes  the blood around a blocked artery to help improve blood flow. This type of procedure may be performed if Angioplasty or stent implants are not an option. SEEK IMMEDIATE MEDICAL CARE IF:   You develop pain or numbness in your arms or legs.  Your arm or leg turns cold, becomes blue in color.  You develop redness, warmth, swelling and pain in your arms or legs. MAKE SURE YOU:   Understand these instructions.  Will watch your condition.  Will get help right away if you are not doing well or get worse. Document Released: 03/11/2004 Document Revised: 04/26/2011 Document Reviewed: 02/06/2008 [facility] IncExitCare Patient Information 2015 [place], [place]. This information is not intended to replace advice given to you by your health care provider. Make sure you discuss any questions you have with your health care provider.

## 2013-12-19 NOTE — Progress Notes (Signed)
Established Carotid Patient   History of Present Illness  Alex Mullins is a 68 y.o. male patient of Dr. Edilia Boickson who has known carotid stenosis and returns today for follow up.  Patient has not had previous carotid artery intervention.  Previous carotid studies demonstrated: RICA <40% stenosis, LICA <40% stenosis. Patient has no history of TIA or stroke symptoms. The patient has not had amaurosis fugax or monocular blindness. The patient has not had facial drooping or hemiplegia. The patient has not had receptive or expressive aphasia.  The patient denies any history of MI. 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  The patient denies New Medical or Surgical History. He states he has not seen his PCP in 4 years, that he stopped the Benicar 4 years ago and did not let his PCP know; states he stopped the statin and blood pressure medication as it "made me sick".  He states that his blood pressure at home is in the 170's/60's  Pt Diabetic: No Pt smoker: smoker  (1.5 ppd, started in his teens)  Pt meds include: Statin : No: see HPI ASA: no Other anticoagulants/antiplatelets: no   Past Medical History  Diagnosis Date  . COPD (chronic obstructive pulmonary disease)   . Hyperlipidemia   . Hypertension   . Vitamin D deficiency   . Carotid artery occlusion     Social History History  Substance Use Topics  . Smoking status: Current Every Day Smoker -- 2.50 packs/day for 50 years    Types: Cigarettes  . Smokeless tobacco: Never Used     Comment: recently tried Nicoderm patches; BP increased, so stopped the patch  . Alcohol Use: 3.0 oz/week    0 Not specified, 5 Shots of liquor per week     Comment: occasionally    Family History Family History  Problem Relation Age of Onset  . Heart disease Father   .  Heart attack Father   . Hypertension Mother   . Cancer Sister [id number]    colon    Surgical History Past Surgical History  Procedure Laterality Date  . Hernia repair    . Inguinal hernia repair  2008    right  . Spine surgery  1982    No Known Allergies  Current Outpatient Prescriptions  Medication Sig Dispense Refill  . Cholecalciferol (VITAMIN D3) 5000 UNITS CAPS Take 5,000 Units by mouth once a week.      . fish oil-omega-3 fatty acids 1000 MG capsule Take 2 g by mouth 3 (three) times daily.      Marland Kitchen. olmesartan-hydrochlorothiazide (BENICAR HCT) 40-25 MG per tablet Take 1 tablet by mouth daily.      . simvastatin (ZOCOR) 40 MG tablet Take 40 mg by mouth at bedtime.       No current facility-administered medications for this visit.    Review of Systems : See HPI for pertinent positives and negatives.  Physical Examination  Filed Vitals:   12/19/13 1336 12/19/13 1338  BP: 190/84 173/85  Pulse: 69   Resp: 20   Height: 6' 0.5" (1.842 m)   Weight: 203 lb 6.4 oz (92.262 kg)    Body mass index is 27.19 kg/(m^2).  General: WDWN male in NAD GAIT: normal Eyes: PERRLA Pulmonary:  Non-labored, CTAB, Negative  Rales, Negative rhonchi, & Negative wheezing, chronic dry cough.  Cardiac: regular Rhythm,  Negative detected murmur.  VASCULAR EXAM Carotid Bruits Right Left   Negative Negative    Aorta is not palpable. Radial pulses are 2+ palpable and equal.                                                                                                                            LE Pulses Right Left       FEMORAL  1+ palpable  1+ palpable        POPLITEAL  not palpable   not palpable       POSTERIOR TIBIAL  not palpable   not palpable        DORSALIS PEDIS      ANTERIOR TIBIAL not palpable  not palpable     Gastrointestinal: soft, nontender, BS WNL, no r/g,  negative palpated masses.  Musculoskeletal: Negative muscle atrophy/wasting. M/S 5/5 throughout, Extremities without  ischemic changes.  Neurologic: A&O X 3; Appropriate Affect ; SENSATION ;normal;  Speech is normal CN 2-12 intact, Pain and light touch intact in extremities, Motor exam as listed above.   Non-Invasive Vascular Imaging CAROTID DUPLEX 12/19/2013   CEREBROVASCULAR DUPLEX EVALUATION    INDICATION: Carotid artery disease     PREVIOUS INTERVENTION(S): None     DUPLEX EXAM:     RIGHT  LEFT  Peak Systolic Velocities (cm/s) End Diastolic Velocities (cm/s) Plaque LOCATION Peak Systolic Velocities (cm/s) End Diastolic Velocities (cm/s) Plaque  94 8  CCA PROXIMAL 99 9   101 11 HT CCA MID 93 12 HT  77 11 HT CCA DISTAL 84 11   114 9 HT ECA 143 11 HT  76 10  ICA PROXIMAL 61 9 CP  59 11  ICA MID 81 19   70 15  ICA DISTAL 109 23     0.75 ICA / CCA Ratio (PSV) 1.17  Antegrade  Vertebral Flow Antegrade   160 Brachial Systolic Pressure (mmHg) 165  Triphasic  Brachial Artery Waveforms Triphasic     Plaque Morphology:  HM = Homogeneous, HT = Heterogeneous, CP = Calcific Plaque, SP = Smooth Plaque, IP = Irregular Plaque     ADDITIONAL FINDINGS:     IMPRESSION:  Bilateral internal carotid artery velocities suggest a <40% stenosis.     Compared to the previous exam:  No significant change in comparison to the last exam on 06/14/2012.      Assessment: Alex Mullins is a 68 y.o. male who presents with asymptomatic minimal bilateral ICA stenosis. No significant change in comparison to the last exam on 06/14/2012.  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 Bilateral LE pulses are not palpable distal to the femorals, no evidence of distal ischemia.  The patient was advised to call his PCP today to make an appointment ASAP re his blood pressure. Unfortunately he continues to smoke.  Plan: Follow-up in 1 year with Carotid Duplex scan and ABI's.  The  patient was counseled re smoking cessation and given several free resources re smoking  cessation. Graduated walking program.   I discussed in depth with the patient the nature of atherosclerosis, and emphasized the importance of maximal medical management including strict control of blood pressure, blood glucose, and lipid levels, obtaining regular exercise, and cessation of smoking.  The patient is aware that without maximal medical management the underlying atherosclerotic disease process will progress, limiting the benefit of any interventions. The patient was given information about stroke prevention and what symptoms should prompt the patient to seek immediate medical care. Thank you for allowing [place] to participate in this patient's care.  Charisse March, RN, MSN, FNP-C [facility] Office: [phone]  Clinic Physician: Edilia Bo  12/19/2013 1:55 PM

## 2014-01-02 ENCOUNTER — Telehealth: Payer: Self-pay | Admitting: Family Medicine

## 2014-01-02 NOTE — Telephone Encounter (Signed)
Pt given new pt appt with christy 1/20 @3 :40, pt states BP running high, advised to go to urgent care if readings continue to be 190's/high 90's, pt voiced understanding

## 2014-03-06 ENCOUNTER — Ambulatory Visit: Payer: Self-pay | Admitting: Family

## 2014-06-04 ENCOUNTER — Emergency Department (HOSPITAL_COMMUNITY): Payer: Medicare Other

## 2014-06-04 ENCOUNTER — Encounter (HOSPITAL_COMMUNITY): Payer: Self-pay

## 2014-06-04 ENCOUNTER — Inpatient Hospital Stay (HOSPITAL_COMMUNITY)
Admission: EM | Admit: 2014-06-04 | Discharge: 2014-06-06 | DRG: 247 | Disposition: A | Discharge status: Home / Self Care | Payer: Medicare Other | Attending: Interventional Cardiology | Admitting: Interventional Cardiology

## 2014-06-04 DIAGNOSIS — F17200 Nicotine dependence, unspecified, uncomplicated: Secondary | ICD-10-CM | POA: Diagnosis present

## 2014-06-04 DIAGNOSIS — E559 Vitamin D deficiency, unspecified: Secondary | ICD-10-CM | POA: Diagnosis not present

## 2014-06-04 DIAGNOSIS — Z8249 Family history of ischemic heart disease and other diseases of the circulatory system: Secondary | ICD-10-CM

## 2014-06-04 DIAGNOSIS — Z955 Presence of coronary angioplasty implant and graft: Secondary | ICD-10-CM

## 2014-06-04 DIAGNOSIS — I6523 Occlusion and stenosis of bilateral carotid arteries: Secondary | ICD-10-CM | POA: Diagnosis not present

## 2014-06-04 DIAGNOSIS — F17210 Nicotine dependence, cigarettes, uncomplicated: Secondary | ICD-10-CM | POA: Diagnosis present

## 2014-06-04 DIAGNOSIS — I10 Essential (primary) hypertension: Secondary | ICD-10-CM | POA: Diagnosis not present

## 2014-06-04 DIAGNOSIS — I2584 Coronary atherosclerosis due to calcified coronary lesion: Secondary | ICD-10-CM | POA: Diagnosis present

## 2014-06-04 DIAGNOSIS — I2510 Atherosclerotic heart disease of native coronary artery without angina pectoris: Secondary | ICD-10-CM | POA: Diagnosis not present

## 2014-06-04 DIAGNOSIS — E785 Hyperlipidemia, unspecified: Secondary | ICD-10-CM | POA: Diagnosis present

## 2014-06-04 DIAGNOSIS — R0789 Other chest pain: Secondary | ICD-10-CM | POA: Diagnosis present

## 2014-06-04 DIAGNOSIS — R001 Bradycardia, unspecified: Secondary | ICD-10-CM | POA: Diagnosis not present

## 2014-06-04 DIAGNOSIS — I25110 Atherosclerotic heart disease of native coronary artery with unstable angina pectoris: Principal | ICD-10-CM | POA: Diagnosis present

## 2014-06-04 DIAGNOSIS — I129 Hypertensive chronic kidney disease with stage 1 through stage 4 chronic kidney disease, or unspecified chronic kidney disease: Secondary | ICD-10-CM | POA: Diagnosis present

## 2014-06-04 DIAGNOSIS — N183 Chronic kidney disease, stage 3 (moderate): Secondary | ICD-10-CM | POA: Diagnosis not present

## 2014-06-04 DIAGNOSIS — I6529 Occlusion and stenosis of unspecified carotid artery: Secondary | ICD-10-CM | POA: Diagnosis present

## 2014-06-04 DIAGNOSIS — N289 Disorder of kidney and ureter, unspecified: Secondary | ICD-10-CM

## 2014-06-04 DIAGNOSIS — I4510 Unspecified right bundle-branch block: Secondary | ICD-10-CM | POA: Diagnosis present

## 2014-06-04 DIAGNOSIS — I739 Peripheral vascular disease, unspecified: Secondary | ICD-10-CM | POA: Diagnosis present

## 2014-06-04 DIAGNOSIS — Z720 Tobacco use: Secondary | ICD-10-CM

## 2014-06-04 DIAGNOSIS — R079 Chest pain, unspecified: Secondary | ICD-10-CM | POA: Diagnosis not present

## 2014-06-04 DIAGNOSIS — J439 Emphysema, unspecified: Secondary | ICD-10-CM | POA: Diagnosis not present

## 2014-06-04 DIAGNOSIS — J449 Chronic obstructive pulmonary disease, unspecified: Secondary | ICD-10-CM | POA: Diagnosis not present

## 2014-06-04 DIAGNOSIS — I200 Unstable angina: Secondary | ICD-10-CM | POA: Diagnosis not present

## 2014-06-04 HISTORY — DX: Unspecified right bundle-branch block: I45.10

## 2014-06-04 HISTORY — DX: Unstable angina: I20.0

## 2014-06-04 LAB — COMPREHENSIVE METABOLIC PANEL
ALBUMIN: 3.9 g/dL (ref 3.5–5.2)
ALT: 32 U/L (ref 0–53)
ANION GAP: 8 (ref 5–15)
AST: 22 U/L (ref 0–37)
Alkaline Phosphatase: 77 U/L (ref 39–117)
BUN: 12 mg/dL (ref 6–23)
CHLORIDE: 101 mmol/L (ref 96–112)
CO2: 27 mmol/L (ref 19–32)
CREATININE: 1.49 mg/dL — AB (ref 0.50–1.35)
Calcium: 10.1 mg/dL (ref 8.4–10.5)
GFR calc Af Amer: 53 mL/min — ABNORMAL LOW (ref >90)
GFR, EST NON AFRICAN AMERICAN: 46 mL/min — AB (ref >90)
Glucose, Bld: 128 mg/dL — ABNORMAL HIGH (ref 70–99)
Potassium: 4.4 mmol/L (ref 3.5–5.1)
Sodium: 136 mmol/L (ref 135–145)
Total Bilirubin: 0.8 mg/dL (ref 0.3–1.2)
Total Protein: 7 g/dL (ref 6.0–8.3)

## 2014-06-04 LAB — MAGNESIUM: Magnesium: 1.9 mg/dL (ref 1.5–2.5)

## 2014-06-04 LAB — CBC
HEMATOCRIT: 41.7 % (ref 39.0–52.0)
HEMOGLOBIN: 14.5 g/dL (ref 13.0–17.0)
MCH: 33 pg (ref 26.0–34.0)
MCHC: 34.8 g/dL (ref 30.0–36.0)
MCV: 95 fL (ref 78.0–100.0)
Platelets: 219 10*3/uL (ref 150–400)
RBC: 4.39 MIL/uL (ref 4.22–5.81)
RDW: 12.7 % (ref 11.5–15.5)
WBC: 6.5 10*3/uL (ref 4.0–10.5)

## 2014-06-04 LAB — HEPARIN LEVEL (UNFRACTIONATED): HEPARIN UNFRACTIONATED: 0.31 [IU]/mL (ref 0.30–0.70)

## 2014-06-04 LAB — TROPONIN I: TROPONIN I: 0.03 ng/mL (ref <0.031)

## 2014-06-04 LAB — MRSA PCR SCREENING: MRSA BY PCR: NEGATIVE

## 2014-06-04 LAB — PROTIME-INR
INR: 0.98 (ref 0.00–1.49)
Prothrombin Time: 13.1 seconds (ref 11.6–15.2)

## 2014-06-04 MED ORDER — SODIUM CHLORIDE 0.9 % IV SOLN
INTRAVENOUS | Status: DC
  Administered 2014-06-04: 16:00:00 via INTRAVENOUS

## 2014-06-04 MED ORDER — HEPARIN BOLUS VIA INFUSION
4000.0000 [IU] | Freq: Once | INTRAVENOUS | Status: AC
  Administered 2014-06-04: 4000 [IU] via INTRAVENOUS
  Filled 2014-06-04: qty 4000

## 2014-06-04 MED ORDER — ONDANSETRON HCL 4 MG/2ML IJ SOLN: 4.0000 mg | Freq: Four times a day (QID) | INTRAMUSCULAR | Status: DC | PRN

## 2014-06-04 MED ORDER — ZOLPIDEM TARTRATE 5 MG PO TABS: 5.0000 mg | ORAL_TABLET | Freq: Every evening | ORAL | Status: DC | PRN

## 2014-06-04 MED ORDER — VARENICLINE TARTRATE 1 MG PO TABS
1.0000 mg | ORAL_TABLET | Freq: Two times a day (BID) | ORAL | Status: DC
  Administered 2014-06-04 – 2014-06-06 (×4): 1 mg via ORAL
  Filled 2014-06-04 (×7): qty 1

## 2014-06-04 MED ORDER — ACETAMINOPHEN 325 MG PO TABS: 650.0000 mg | ORAL_TABLET | ORAL | Status: DC | PRN

## 2014-06-04 MED ORDER — SODIUM CHLORIDE 0.9 % IJ SOLN: 3.0000 mL | INTRAMUSCULAR | Status: DC | PRN

## 2014-06-04 MED ORDER — ASPIRIN EC 81 MG PO TBEC
81.0000 mg | DELAYED_RELEASE_TABLET | Freq: Every day | ORAL | Status: DC
  Administered 2014-06-05 – 2014-06-06 (×2): 81 mg via ORAL
  Filled 2014-06-04 (×2): qty 1

## 2014-06-04 MED ORDER — NITROGLYCERIN 0.4 MG SL SUBL: 0.4000 mg | SUBLINGUAL_TABLET | SUBLINGUAL | Status: DC | PRN

## 2014-06-04 MED ORDER — ATORVASTATIN CALCIUM 20 MG PO TABS
20.0000 mg | ORAL_TABLET | Freq: Every day | ORAL | Status: DC
  Administered 2014-06-04: 20 mg via ORAL
  Filled 2014-06-04 (×2): qty 1

## 2014-06-04 MED ORDER — METOPROLOL TARTRATE 12.5 MG HALF TABLET
12.5000 mg | ORAL_TABLET | Freq: Two times a day (BID) | ORAL | Status: DC
  Administered 2014-06-04 – 2014-06-06 (×5): 12.5 mg via ORAL
  Filled 2014-06-04 (×6): qty 1

## 2014-06-04 MED ORDER — PANTOPRAZOLE SODIUM 40 MG PO TBEC
40.0000 mg | DELAYED_RELEASE_TABLET | Freq: Every day | ORAL | Status: DC
  Administered 2014-06-05 – 2014-06-06 (×2): 40 mg via ORAL
  Filled 2014-06-04 (×2): qty 1

## 2014-06-04 MED ORDER — AMLODIPINE BESYLATE 5 MG PO TABS
5.0000 mg | ORAL_TABLET | Freq: Every day | ORAL | Status: DC
  Administered 2014-06-05 – 2014-06-06 (×2): 5 mg via ORAL
  Filled 2014-06-04 (×3): qty 1

## 2014-06-04 MED ORDER — NITROGLYCERIN 2 % TD OINT
0.5000 [in_us] | TOPICAL_OINTMENT | Freq: Four times a day (QID) | TRANSDERMAL | Status: DC
  Administered 2014-06-04 – 2014-06-06 (×8): 0.5 [in_us] via TOPICAL
  Filled 2014-06-04 (×6): qty 30
  Filled 2014-06-04: qty 1
  Filled 2014-06-04 (×24): qty 30

## 2014-06-04 MED ORDER — SODIUM CHLORIDE 0.9 % IV SOLN: 250.0000 mL | INTRAVENOUS | Status: DC | PRN

## 2014-06-04 MED ORDER — ASPIRIN 81 MG PO CHEW: 324.0000 mg | CHEWABLE_TABLET | Freq: Once | ORAL | Status: DC

## 2014-06-04 MED ORDER — ALPRAZOLAM 0.25 MG PO TABS: 0.2500 mg | ORAL_TABLET | Freq: Two times a day (BID) | ORAL | Status: DC | PRN

## 2014-06-04 MED ORDER — HEPARIN (PORCINE) IN NACL 100-0.45 UNIT/ML-% IJ SOLN
1250.0000 [IU]/h | INTRAMUSCULAR | Status: DC
  Administered 2014-06-04: 1100 [IU]/h via INTRAVENOUS
  Filled 2014-06-04 (×3): qty 250

## 2014-06-04 MED ORDER — SODIUM CHLORIDE 0.9 % IJ SOLN: 3.0000 mL | Freq: Two times a day (BID) | INTRAMUSCULAR | Status: DC

## 2014-06-04 NOTE — ED Provider Notes (Signed)
CSN: [id number]     Arrival date & time 06/04/14  1113 History   First MD Initiated Contact with Patient 06/04/14 1113     Chief Complaint  Patient presents with  . Chest Pain    HPI  Patient presents after episodes of chest pain that began about 10 hours ago. Patient was generally well when he went to bed yesterday, awoke at 2 AM with left-sided chest pain. Pain is focally in the left upper chest with radiation to both arms. Pain resolved with aspirin, nitroglycerin. The patient tried walking, while in the emergency department he had return of the pain. Currently pain is 2/10. Pain is sore, pressure-like. There is no associated nausea, lightheadedness, syncope. No prior cardiac evaluations.   Past Medical History  Diagnosis Date  . COPD (chronic obstructive pulmonary disease)   . Hyperlipidemia   . Hypertension   . Vitamin D deficiency   . Carotid artery occlusion    Past Surgical History  Procedure Laterality Date  . Hernia repair    . Inguinal hernia repair  2008    right  . Spine surgery  1982   Family History  Problem Relation Age of Onset  . Heart disease Father   . Heart attack Father   . Hypertension Mother   . Cancer Sister 75    colon   History  Substance Use Topics  . Smoking status: Current Every Day Smoker -- 1.00 packs/day for 50 years    Types: Cigarettes  . Smokeless tobacco: Never Used     Comment: recently tried Nicoderm patches; BP increased, so stopped the patch  . Alcohol Use: 3.0 oz/week    5 Shots of liquor, 0 Standard drinks or equivalent per week     Comment: 3 drinks per day    Review of Systems  Constitutional:       Per HPI, otherwise negative  HENT:       Per HPI, otherwise negative  Respiratory:       Per HPI, otherwise negative  Cardiovascular:       Per HPI, otherwise negative  Gastrointestinal: Negative for vomiting.  Endocrine:       Negative aside from HPI  Genitourinary:       Neg aside from HPI   Musculoskeletal:        Per HPI, otherwise negative  Skin: Negative.   Neurological: Negative for syncope.      Allergies  Review of patient's allergies indicates no known allergies.  Home Medications   Prior to Admission medications   Medication Sig Start Date End Date Taking? Authorizing Provider  Cholecalciferol (VITAMIN D3) 5000 UNITS CAPS Take 5,000 Units by mouth once a week.      Historical Provider, MD  fish oil-omega-3 fatty acids 1000 MG capsule Take 2 g by mouth 3 (three) times daily.      Historical Provider, MD  olmesartan-hydrochlorothiazide (BENICAR HCT) 40-25 MG per tablet Take 1 tablet by mouth daily.      Historical Provider, MD  simvastatin (ZOCOR) 40 MG tablet Take 40 mg by mouth at bedtime.      Historical Provider, MD   BP 157/67 mmHg  Pulse 71  Temp(Src) 97.6 F (36.4 C) (Oral)  Resp 16  Ht 6' (1.829 m)  Wt 204 lb (92.534 kg)  BMI 27.66 kg/m2  SpO2 98% Physical Exam  Constitutional: He is oriented to person, place, and time. He appears well-developed. No distress.  HENT:  Head: Normocephalic and atraumatic.  Eyes:  Conjunctivae and EOM are normal.  Cardiovascular: Normal rate, regular rhythm and intact distal pulses.   Pulmonary/Chest: Effort normal. No stridor. No respiratory distress.  Abdominal: He exhibits no distension.  Musculoskeletal: He exhibits no edema.  Neurological: He is alert and oriented to person, place, and time.  Skin: Skin is warm and dry.  Psychiatric: He has a normal mood and affect.  Nursing note and vitals reviewed.   ED Course  Procedures (including critical care time) Labs Review Labs Reviewed  CBC  COMPREHENSIVE METABOLIC PANEL  MAGNESIUM  PROTIME-INR  TROPONIN I    Imaging Review No results found.   EKG Interpretation   Date/Time:  Tuesday June 04 2014 11:24:25 EDT Ventricular Rate:  70 PR Interval:  145 QRS Duration: 130 QT Interval:  427 QTC Calculation: 461 R Axis:   43 Text Interpretation:  Sinus rhythm Right  bundle branch block Sinus rhythm  Right bundle branch block T wave inversion Abnormal ekg Confirmed by  Gerhard MunchLOCKWOOD, Beaumont Austad  MD ([id number]) on 06/04/2014 12:04:02 PM       Cardiac 80 sinus rhythm normal Pulse ox 100% room air normal  MDM   Patient presents with new chest pain that awakened him in the middle the night. Note, the patient had substantial improvement with nitroglycerin, but pain recurred with exertion. Given these characteristics, there was immediate suspicion for unstable angina. Patient has no recent cardiac evaluation. Though initial troponin was reassuring, he was admitted to the cardiology team for further evaluation and management.     Gerhard Munchobert Tiarrah Saville, MD 06/07/14 [phone]

## 2014-06-04 NOTE — H&P (Signed)
Patient ID: Alex ForgetCharles Mullins MRN: [id number], DOB/AGE: [birth year]   Admit date: 06/04/2014   Primary Physician: Mathis FareWeeks, Susan Ramsey, NP Primary Cardiologist: New  HPI: 69 y/o (looks [id number]) retired [employer] architecttruck driver, followed by Dr Jenita SeashoreSamuel Kelly with a history of HTN, COPD, smoking, and PVD. He has been seen by Dr Edilia Boickson in the past for moderate carotid disease. He also has claudication and has been told he has blockages in the arteries to his legs. He has no history of CAD, MI, or prior cardiac evaluation. His father died of an MI at age 70. He is admitted through the ED 06/04/14 after he developed bilateral arm pain and chest pain that awakened him from sleep around 2 am. He says he became diaphoretic. NTG give by EMS relieved his symptoms but he has had recurrent symptoms when he got to use the BR in the ED. Troponin negative x one, EKG shows NSR with RBBB (no old EKGs). His SCr is 1.49 (no old labs in computer). He is currently pain free.    Problem List: Past Medical History  Diagnosis Date  . COPD (chronic obstructive pulmonary disease)   . Hyperlipidemia   . Hypertension   . Vitamin D deficiency   . Carotid artery occlusion     Past Surgical History  Procedure Laterality Date  . Hernia repair    . Inguinal hernia repair  2008    right  . Spine surgery  1982     Allergies: No Known Allergies   Home Medications Current Facility-Administered Medications  Medication Dose Route Frequency Provider Last Rate Last Dose  . aspirin chewable tablet 324 mg  324 mg Oral Once Gerhard Munchobert Lockwood, MD   324 mg at 06/04/14 1203   Current Outpatient Prescriptions  Medication Sig Dispense Refill  . amLODipine (NORVASC) 5 MG tablet Take 5 mg by mouth daily.    Marland Kitchen. aspirin 325 MG tablet Take 325 mg by mouth daily.    . Cholecalciferol (VITAMIN D PO) Take 1 tablet by mouth daily.    Marland Kitchen. losartan (COZAAR) 100 MG tablet Take 100 mg by mouth daily.    . varenicline (CHANTIX) 1 MG tablet Take 1 mg by mouth  2 (two) times daily.       Family History  Problem Relation Age of Onset  . Heart disease Father   . Heart attack Father   . Hypertension Mother   . Cancer Sister [id number]    colon     History   Social History  . Marital Status: Married    Spouse Name: N/A  . Number of Children: N/A  . Years of Education: N/A   Occupational History  . Not on file.   Social History Main Topics  . Smoking status: Current Every Day Smoker -- 1.00 packs/day for 50 years    Types: Cigarettes  . Smokeless tobacco: Never Used     Comment: recently tried Nicoderm patches; BP increased, so stopped the patch  . Alcohol Use: 3.0 oz/week    5 Shots of liquor, 0 Standard drinks or equivalent per week     Comment: 3 drinks per day  . Drug Use: No  . Sexual Activity: Not on file   Other Topics Concern  . Not on file   Social History Narrative     Review of Systems: General: negative for chills, fever, night sweats or weight changes.  Cardiovascular: negative for chest pain, dyspnea on exertion, edema, orthopnea, palpitations, paroxysmal nocturnal dyspnea or shortness  of breath Dermatological: negative for rash Respiratory: negative for cough or wheezing Urologic: negative for hematuria Abdominal: negative for nausea, vomiting, diarrhea, bright red blood per rectum, melena, or hematemesis Neurologic: negative for visual changes, syncope, or dizziness All other systems reviewed and are otherwise negative except as noted above.  Physical Exam: Blood pressure 155/69, pulse 62, temperature 97.6 F (36.4 C), temperature source Oral, resp. rate 18, height 6' (1.829 m), weight 204 lb (92.534 kg), SpO2 98 %.  General appearance: alert, cooperative, appears older than stated age and no distress Neck: no JVD and RCA bruit Lungs: clear to auscultation bilaterally Heart: regular rate and rhythm Abdomen: soft, non-tender; bowel sounds normal; no masses,  no organomegaly Extremities: extremities normal,  atraumatic, no cyanosis or edema Pulses: diminnished LE distal pulses, intact FA pulses with soft RFA bruit Skin: Skin color, texture, turgor normal. No rashes or lesions Neurologic: Grossly normal    Labs:   Results for orders placed or performed during the hospital encounter of 06/04/14 (from the past 24 hour(s))  CBC     Status: None   Collection Time: 06/04/14 12:20 PM  Result Value Ref Range   WBC 6.5 4.0 - 10.5 K/uL   RBC 4.39 4.22 - 5.81 MIL/uL   Hemoglobin 14.5 13.0 - 17.0 g/dL   HCT 86.5 78.4 - 69.6 %   MCV 95.0 78.0 - 100.0 fL   MCH 33.0 26.0 - 34.0 pg   MCHC 34.8 30.0 - 36.0 g/dL   RDW 29.5 28.4 - 13.2 %   Platelets 219 150 - 400 K/uL  Comprehensive metabolic panel     Status: Abnormal   Collection Time: 06/04/14 12:20 PM  Result Value Ref Range   Sodium 136 135 - 145 mmol/L   Potassium 4.4 3.5 - 5.1 mmol/L   Chloride 101 96 - 112 mmol/L   CO2 27 19 - 32 mmol/L   Glucose, Bld 128 (H) 70 - 99 mg/dL   BUN 12 6 - 23 mg/dL   Creatinine, Ser 4.40 (H) 0.50 - 1.35 mg/dL   Calcium 10.2 8.4 - 72.5 mg/dL   Total Protein 7.0 6.0 - 8.3 g/dL   Albumin 3.9 3.5 - 5.2 g/dL   AST 22 0 - 37 U/L   ALT 32 0 - 53 U/L   Alkaline Phosphatase 77 39 - 117 U/L   Total Bilirubin 0.8 0.3 - 1.2 mg/dL   GFR calc non Af Amer 46 (L) >90 mL/min   GFR calc Af Amer 53 (L) >90 mL/min   Anion gap 8 5 - 15  Magnesium     Status: None   Collection Time: 06/04/14 12:20 PM  Result Value Ref Range   Magnesium 1.9 1.5 - 2.5 mg/dL  Protime-INR     Status: None   Collection Time: 06/04/14 12:20 PM  Result Value Ref Range   Prothrombin Time 13.1 11.6 - 15.2 seconds   INR 0.98 0.00 - 1.49  Troponin I (order at [facility])     Status: None   Collection Time: 06/04/14 12:20 PM  Result Value Ref Range   Troponin I 0.03 <0.031 ng/mL     Radiology/Studies: Dg Chest 2 View  06/04/2014   CLINICAL DATA:  Chest pain since 2 a.m. Patient awaken from sleep with chest pain.  EXAM: CHEST  2 VIEW  COMPARISON:   None.  FINDINGS: The cardiopericardial silhouette is mildly enlarged for projection. Diffuse interstitial prominence is present, which may be associated with emphysema. There may be some interstitial  pulmonary edema superimposed. Bilateral pleural apical scarring is present. There is no focal airspace consolidation. Aortic arch atherosclerosis. No pleural effusion.  IMPRESSION: Cardiomegaly and diffuse interstitial prominence. Interstitial prominence may be associated with emphysema in this cigarette smoker. Interstitial pulmonary edema may also produce this appearance.   Electronically Signed   By: Andreas Newport M.D.   On: 06/04/2014 12:14    EKG:NSR RBBB (no old EKGs)  ASSESSMENT AND PLAN:  Principal Problem:   Unstable angina Active Problems:   Essential hypertension   Renal insufficiency- ? chronic   COPD with emphysema   Carotid stenosis- moderate, followed by Dr Edilia Bo   FM Hx of CAD- F - died 46 MI   RBBB   Smoker   Claudication of lower extremity   PLAN: Heparin, NTG, hydrate overnight, hold ACE, Rx B/P with hydralazine, low dose statin (he didn't tolerate statins in the past) plan cath in am   SignedAbelino Derrick, PA-C 06/04/2014, 2:17 PM   I have examined the patient and reviewed assessment and plan and discussed with patient.  Agree with above as stated.  The patient understands that risks include but are not limited to stroke (1 in 1000), death (1 in 1000), kidney failure [usually temporary] (1 in 500), bleeding (1 in 200), allergic reaction [possibly serious] (1 in 200), and agrees to proceed.  All questions of the patient and his family were answered.  Very high pretest probability of significant CAD, given unstable angina.  He needs to stop smoking.    Alex Sunga S.

## 2014-06-04 NOTE — ED Notes (Signed)
Cardiology at bedside.

## 2014-06-04 NOTE — Progress Notes (Signed)
ANTICOAGULATION CONSULT NOTE - Initial Consult  Pharmacy Consult for heparin Indication: chest pain/ACS  No Known Allergies  Patient Measurements: Height: 6' (182.9 cm) Weight: 201 lb 8 oz (91.4 kg) IBW/kg (Calculated) : 77.6 Heparin Dosing Weight: 92.5 kg  Vital Signs: Temp: 97.9 F (36.6 C) (04/19 2045) Temp Source: Oral (04/19 2045) BP: 150/98 mmHg (04/19 2045) Pulse Rate: 68 (04/19 2045)  Labs:  Recent Labs  06/04/14 1220 06/04/14 2129  HGB 14.5  --   HCT 41.7  --   PLT 219  --   LABPROT 13.1  --   INR 0.98  --   HEPARINUNFRC  --  0.31  CREATININE 1.49*  --   TROPONINI 0.03  --     Estimated Creatinine Clearance: 51.4 mL/min (by C-G formula based on Cr of 1.49).   Medical History: Past Medical History  Diagnosis Date  . COPD (chronic obstructive pulmonary disease)   . Hyperlipidemia   . Hypertension   . Vitamin D deficiency   . Carotid artery occlusion     Medications:  Prescriptions prior to admission  Medication Sig Dispense Refill Last Dose  . amLODipine (NORVASC) 5 MG tablet Take 5 mg by mouth daily.   06/04/2014 at Unknown time  . aspirin 325 MG tablet Take 325 mg by mouth daily.   06/04/2014 at Unknown time  . Cholecalciferol (VITAMIN D PO) Take 1 tablet by mouth daily.   06/04/2014 at Unknown time  . losartan (COZAAR) 100 MG tablet Take 100 mg by mouth daily.   06/04/2014 at Unknown time  . varenicline (CHANTIX) 1 MG tablet Take 1 mg by mouth 2 (two) times daily.   06/04/2014 at Unknown time      Assessment: 69 yo male admitted with bilateral arm pain and chest pain. Pt has history of HTN, smoking and PVD. Scr 1.49, eCrCl 52 ml/min, CBC stable and wnl. Anticipate cardiac cath tomorrow morning.   Initial HL is therapeutic at 0.31 on heparin 1100 units/hr. No issues with infusion or bleeding is reported.  Goal of Therapy:  Heparin level 0.3-0.7 units/ml Monitor platelets by anticoagulation protocol: Yes   Plan:  Continue heparin 1100  units/hr Daily HL, CBC F/u plans for cath  [facility]burn Medical CenterCorey M. Newman PiesBall, PharmD Clinical Pharmacist Pager [phone]  06/04/2014 10:01 PM

## 2014-06-04 NOTE — ED Notes (Signed)
[facility]- CP this morning around 0200, called EMS around 10. No cardiac hx. RBB on monitor. 324 ASA en route and 1 nitro. Pain relieved with nitro. 18g LAC IV in place.

## 2014-06-04 NOTE — Progress Notes (Signed)
ANTICOAGULATION CONSULT NOTE - Initial Consult  Pharmacy Consult for heparin Indication: chest pain/ACS  No Known Allergies  Patient Measurements: Height: 6' (182.9 cm) Weight: 204 lb (92.534 kg) IBW/kg (Calculated) : 77.6 Heparin Dosing Weight: 92.5 kg  Vital Signs: Temp: 97.6 F (36.4 C) (04/19 1124) Temp Source: Oral (04/19 1124) BP: 162/76 mmHg (04/19 1430) Pulse Rate: 59 (04/19 1430)  Labs:  Recent Labs  06/04/14 1220  HGB 14.5  HCT 41.7  PLT 219  LABPROT 13.1  INR 0.98  CREATININE 1.49*  TROPONINI 0.03    Estimated Creatinine Clearance: 51.4 mL/min (by C-G formula based on Cr of 1.49).   Medical History: Past Medical History  Diagnosis Date  . COPD (chronic obstructive pulmonary disease)   . Hyperlipidemia   . Hypertension   . Vitamin D deficiency   . Carotid artery occlusion     Medications:   (Not in a hospital admission)    Assessment: 69 yo male admitted with bilateral arm pain and chest pain. Pt has history of HTN, smoking and PVD. Scr 1.49, eCrCl 52 ml/min, CBC stable and wnl. Anticipate cardiac cath tomorrow morning.   Goal of Therapy:  Heparin level 0.3-0.7 units/ml Monitor platelets by anticoagulation protocol: Yes   Plan:  Heparin bolus 4000 units x1, continue drip at 1100 units/hr Daily HL, CBC Check first HL this evening F/u plans for cath   Agapito GamesAlison Dayvion Sans, PharmD, BCPS Clinical Pharmacist Pager: [phone] 06/04/2014 2:44 PM

## 2014-06-05 ENCOUNTER — Encounter (HOSPITAL_COMMUNITY): Payer: Self-pay | Admitting: Cardiovascular Disease

## 2014-06-05 ENCOUNTER — Encounter (HOSPITAL_COMMUNITY)
Admission: EM | Disposition: A | Discharge status: Home / Self Care | Payer: Medicare Other | Source: Home / Self Care | Attending: Interventional Cardiology

## 2014-06-05 DIAGNOSIS — I200 Unstable angina: Secondary | ICD-10-CM

## 2014-06-05 DIAGNOSIS — I25110 Atherosclerotic heart disease of native coronary artery with unstable angina pectoris: Secondary | ICD-10-CM

## 2014-06-05 DIAGNOSIS — R079 Chest pain, unspecified: Secondary | ICD-10-CM

## 2014-06-05 DIAGNOSIS — I10 Essential (primary) hypertension: Secondary | ICD-10-CM

## 2014-06-05 HISTORY — PX: CORONARY STENT PLACEMENT: SHX1402

## 2014-06-05 HISTORY — PX: LEFT HEART CATHETERIZATION WITH CORONARY ANGIOGRAM: SHX5451

## 2014-06-05 LAB — CBC
HCT: 39.1 % (ref 39.0–52.0)
Hemoglobin: 13.6 g/dL (ref 13.0–17.0)
MCH: 33.2 pg (ref 26.0–34.0)
MCHC: 34.8 g/dL (ref 30.0–36.0)
MCV: 95.4 fL (ref 78.0–100.0)
Platelets: 210 10*3/uL (ref 150–400)
RBC: 4.1 MIL/uL — ABNORMAL LOW (ref 4.22–5.81)
RDW: 12.7 % (ref 11.5–15.5)
WBC: 5.5 10*3/uL (ref 4.0–10.5)

## 2014-06-05 LAB — HEPARIN LEVEL (UNFRACTIONATED): Heparin Unfractionated: 0.21 IU/mL — ABNORMAL LOW (ref 0.30–0.70)

## 2014-06-05 LAB — BASIC METABOLIC PANEL
Anion gap: 8 (ref 5–15)
BUN: 13 mg/dL (ref 6–23)
CHLORIDE: 104 mmol/L (ref 96–112)
CO2: 26 mmol/L (ref 19–32)
Calcium: 9 mg/dL (ref 8.4–10.5)
Creatinine, Ser: 1.5 mg/dL — ABNORMAL HIGH (ref 0.50–1.35)
GFR calc Af Amer: 53 mL/min — ABNORMAL LOW (ref >90)
GFR calc non Af Amer: 46 mL/min — ABNORMAL LOW (ref >90)
Glucose, Bld: 104 mg/dL — ABNORMAL HIGH (ref 70–99)
POTASSIUM: 4.3 mmol/L (ref 3.5–5.1)
Sodium: 138 mmol/L (ref 135–145)

## 2014-06-05 LAB — LIPID PANEL
Cholesterol: 209 mg/dL — ABNORMAL HIGH (ref 0–200)
HDL: 33 mg/dL — ABNORMAL LOW
LDL Cholesterol: 139 mg/dL — ABNORMAL HIGH (ref 0–99)
Total CHOL/HDL Ratio: 6.3 ratio
Triglycerides: 183 mg/dL — ABNORMAL HIGH
VLDL: 37 mg/dL (ref 0–40)

## 2014-06-05 LAB — POCT ACTIVATED CLOTTING TIME: ACTIVATED CLOTTING TIME: 565 s

## 2014-06-05 SURGERY — LEFT HEART CATHETERIZATION WITH CORONARY ANGIOGRAM

## 2014-06-05 MED ORDER — ATORVASTATIN CALCIUM 80 MG PO TABS
80.0000 mg | ORAL_TABLET | Freq: Every day | ORAL | Status: DC
  Administered 2014-06-05: 19:00:00 80 mg via ORAL
  Filled 2014-06-05 (×2): qty 1

## 2014-06-05 MED ORDER — FENTANYL CITRATE (PF) 100 MCG/2ML IJ SOLN
INTRAMUSCULAR | Status: AC
  Filled 2014-06-05: qty 2

## 2014-06-05 MED ORDER — TICAGRELOR 90 MG PO TABS
ORAL_TABLET | ORAL | Status: AC
  Administered 2014-06-05: 22:00:00 90 mg via ORAL
  Filled 2014-06-05: qty 2

## 2014-06-05 MED ORDER — NITROGLYCERIN 1 MG/10 ML FOR IR/CATH LAB
INTRA_ARTERIAL | Status: AC
  Filled 2014-06-05: qty 10

## 2014-06-05 MED ORDER — HEPARIN SODIUM (PORCINE) 1000 UNIT/ML IJ SOLN
INTRAMUSCULAR | Status: AC
  Filled 2014-06-05: qty 1

## 2014-06-05 MED ORDER — MIDAZOLAM HCL 2 MG/2ML IJ SOLN
INTRAMUSCULAR | Status: AC
  Filled 2014-06-05: qty 2

## 2014-06-05 MED ORDER — ATROPINE SULFATE 0.1 MG/ML IJ SOLN
INTRAMUSCULAR | Status: AC
  Filled 2014-06-05: qty 10

## 2014-06-05 MED ORDER — TICAGRELOR 90 MG PO TABS
90.0000 mg | ORAL_TABLET | Freq: Two times a day (BID) | ORAL | Status: DC
  Administered 2014-06-05 – 2014-06-06 (×2): 90 mg via ORAL
  Filled 2014-06-05 (×3): qty 1

## 2014-06-05 MED ORDER — SODIUM CHLORIDE 0.9 % IV SOLN: INTRAVENOUS | Status: AC

## 2014-06-05 MED ORDER — SODIUM CHLORIDE 0.9 % IV SOLN: 250.0000 mL | INTRAVENOUS | Status: DC | PRN

## 2014-06-05 MED ORDER — SODIUM CHLORIDE 0.9 % IV SOLN
1.0000 mL/kg/h | INTRAVENOUS | Status: DC
Start: 2014-06-06 — End: 2014-06-05

## 2014-06-05 MED ORDER — HEPARIN (PORCINE) IN NACL 2-0.9 UNIT/ML-% IJ SOLN
INTRAMUSCULAR | Status: AC
  Filled 2014-06-05: qty 1500

## 2014-06-05 MED ORDER — ANGIOPLASTY BOOK
Freq: Once | Status: AC
  Administered 2014-06-05: 22:00:00
  Filled 2014-06-05: qty 1

## 2014-06-05 MED ORDER — VERAPAMIL HCL 2.5 MG/ML IV SOLN
INTRAVENOUS | Status: AC
  Filled 2014-06-05: qty 2

## 2014-06-05 MED ORDER — ONDANSETRON HCL 4 MG/2ML IJ SOLN
INTRAMUSCULAR | Status: AC
  Filled 2014-06-05: qty 2

## 2014-06-05 MED ORDER — SODIUM CHLORIDE 0.9 % IJ SOLN: 3.0000 mL | Freq: Two times a day (BID) | INTRAMUSCULAR | Status: DC

## 2014-06-05 MED ORDER — BIVALIRUDIN 250 MG IV SOLR
INTRAVENOUS | Status: AC
  Filled 2014-06-05: qty 250

## 2014-06-05 MED ORDER — LIDOCAINE HCL (PF) 1 % IJ SOLN
INTRAMUSCULAR | Status: AC
  Filled 2014-06-05: qty 30

## 2014-06-05 MED ORDER — SODIUM CHLORIDE 0.9 % IJ SOLN: 3.0000 mL | INTRAMUSCULAR | Status: DC | PRN

## 2014-06-05 MED ORDER — ASPIRIN 81 MG PO CHEW: 81.0000 mg | CHEWABLE_TABLET | ORAL | Status: DC

## 2014-06-05 NOTE — CV Procedure (Signed)
Cardiac Catheterization Procedure Note  Name: Elio ForgetCharles Episcopo MRN: [id number] DOB: 1945-05-17  Procedure: Left Heart Cath, Selective Coronary Angiography,  PTCA and stenting of the mid RCA X 2  Indication: unstable angina  Medications:  Sedation:  1 mg IV Versed, 75 mcg IV Fentanyl  Contrast:  175 ml Omnipaque  Procedural Details: The right wrist was prepped, draped, and anesthetized with 1% lidocaine. Using the modified Seldinger technique, a 5 French Slender sheath was introduced into the right radial artery. 3 mg of verapamil was administered through the sheath, weight-based unfractionated heparin was administered intravenously. A Jackie catheter was used for selective coronary angiography and to measure LV pressure.  Catheter exchanges were performed over an exchange length guidewire. There were no immediate procedural complications.  Procedural Findings:  Hemodynamics: AO:  151/72   mmHg LV:  153/12    mmHg LVEDP: 19  mmHg  Coronary angiography: Coronary dominance: right    Left Main:  Normal  Left Anterior Descending (LAD):  Normal in size and heavily calcified. It is 20% proximal stenosis. There is 60% stenosis in the midsegment at the origin of the first septal perforator. The rest of the vessel has 20% mid stenosis.  1st diagonal (D1):  Normal in size with 30% proximal stenosis.  2nd diagonal (D2):  Very small in size.  3rd diagonal (D3):  Very small in size.  Circumflex (LCx):  Normal in size and nondominant. There is 20% diffuse stenosis in the midsegment.  1st obtuse marginal:  Medium in size with minor irregularities.  2nd obtuse marginal:  Normal in size with diffuse 20% proximal disease.  3rd obtuse marginal:  Normal in size with minor irregularities.    Right Coronary Artery:  Heavily calcified with 30% proximal stenosis. There is 99% stenosis in the midsegment with diffuse 80% disease throughout the whole midsegment following that. The vessel has TIMI 2  flow.   Posterior descending artery: Normal   Posterior AV segment: Normal  Posterolateral branchs:  Normal   Left ventriculography: Was not performed due to chronic kidney disease. EF was normal by echo.   PCI Note:  Following the diagnostic procedure, the decision was made to proceed with PCI.  Weight-based bivalirudin was given for anticoagulation. Once a therapeutic ACT was achieved, a 6 JamaicaFrench JR4  guide catheter was inserted.  A intuition  coronary guidewire was used to cross the lesion.  The lesion was predilated with a 2.0 x 20  balloon.   I could not deliver a stent due to heavy calcifications. Thus, I used a 2.75 noncompliant balloon to predilate the lesion with full expansion. In spite of this, I was not able to deliver the stent. Thus, I used a run through wire as a buddy wire. Throughout these manipulations, the patient became bradycardic and required 0.5 mg of atropine with resolution of bradycardia. I was then able to deliver a 3.0 x 38 mm Promus drug-eluting stent. This was not long enough to cover the whole lesion. I used a 3.5 x 20 mm Promus drug-eluting stent to overlap proximally.   The stent was postdilated with a 3.75  noncompliant balloon.  Following PCI, there was 0% residual stenosis and TIMI-3 flow. Final angiography confirmed an excellent result. The patient tolerated the procedure well. There were no immediate procedural complications. A TR band was used for radial hemostasis. The patient was transferred to the post catheterization recovery area for further monitoring.  PCI Data: Vessel - RCA /Segment - mid  Percent Stenosis (pre)  99%  TIMI-flow 2  Stent : 3.0 x 38 mm overlapped with a 3.5 x 20 mm Promus drug-eluting stents  Percent Stenosis (post) 0%  TIMI-flow (post) 3  Final Conclusions:  1. Severe one-vessel coronary artery disease with heavily calcified mid RCA stenosis. There is also moderate mid LAD stenosis. The coronary arteries are overall heavily  calcified. 2. Normal LV systolic function by echo. Mildly elevated left ventricular end-diastolic pressure. 3. Successful angioplasty and drug-eluting stent placement 2 to the right coronary artery.    Recommendations:  Dual antiplatelet therapy for at least one year. Aggressive treatment of risk factors and smoking cessation are highly recommended.  Lorine Bears MD, [facility] 06/05/2014, 4:31 PM

## 2014-06-05 NOTE — Interval H&P Note (Signed)
History and Physical Interval Note:  06/05/2014 3:07 PM  Alex Mullins  has presented today for surgery, with the diagnosis of cp  The various methods of treatment have been discussed with the patient and family. After consideration of risks, benefits and other options for treatment, the patient has consented to  Procedure(s): LEFT HEART CATHETERIZATION WITH CORONARY ANGIOGRAM (N/A) as a surgical intervention .  The patient's history has been reviewed, patient examined, no change in status, stable for surgery.  I have reviewed the patient's chart and labs.  Questions were answered to the patient's satisfaction.     Lorine BearsMuhammad Thirza Pellicano

## 2014-06-05 NOTE — Progress Notes (Signed)
TR BAND REMOVAL  LOCATION:    right radial  DEFLATED PER PROTOCOL:    Yes.    TIME BAND OFF / DRESSING APPLIED:    20:30   SITE UPON ARRIVAL:    Level 0  SITE AFTER BAND REMOVAL:    Level 0  REVERSE ALLEN'S TEST:     positive  CIRCULATION SENSATION AND MOVEMENT:    Within Normal Limits   Yes.    COMMENTS:    

## 2014-06-05 NOTE — H&P (View-Only) (Signed)
Subjective:  Objective: Filed Vitals:   06/04/14 2351 06/05/14 0600 06/05/14 0744 06/05/14 0952  BP: 144/76 136/70 129/74 155/75  Pulse: 60 64 58 54  Temp: 97.7 F (36.5 C) 97.6 F (36.4 C) 97.5 F (36.4 C)   TempSrc: Oral Oral Oral   Resp: Height:      Weight:  199 lb 3.2 oz (90.357 kg)    SpO2: 95% 92% 98%    Weight change:  No intake or output data in the 24 hours ending 06/05/14 1225   Exam  Patient being wheeled to cath lab   Not performed  Lab Results: Results for orders placed or performed during the hospital encounter of 06/04/14 (from the past 24 hour(s))  MRSA PCR Screening     Status: None   Collection Time: 06/04/14  4:28 PM  Result Value Ref Range   MRSA by PCR NEGATIVE NEGATIVE  Heparin level (unfractionated)     Status: None   Collection Time: 06/04/14  9:29 PM  Result Value Ref Range   Heparin Unfractionated 0.31 0.30 - 0.70 IU/mL  Basic metabolic panel     Status: Abnormal   Collection Time: 06/05/14  6:10 AM  Result Value Ref Range   Sodium 138 135 - 145 mmol/L   Potassium 4.3 3.5 - 5.1 mmol/L   Chloride 104 96 - 112 mmol/L   CO2 26 19 - 32 mmol/L   Glucose, Bld 104 (H) 70 - 99 mg/dL   BUN 13 6 - 23 mg/dL   Creatinine, Ser 1.61 (H) 0.50 - 1.35 mg/dL   Calcium 9.0 8.4 - 09.6 mg/dL   GFR calc non Af Amer 46 (L) >90 mL/min   GFR calc Af Amer 53 (L) >90 mL/min   Anion gap 8 5 - 15  Lipid panel     Status: Abnormal   Collection Time: 06/05/14  6:10 AM  Result Value Ref Range   Cholesterol 209 (H) 0 - 200 mg/dL   Triglycerides 045 (H) <150 mg/dL   HDL 33 (L) >40 mg/dL   Total CHOL/HDL Ratio 6.3 RATIO   VLDL 37 0 - 40 mg/dL   LDL Cholesterol 981 (H) 0 - 99 mg/dL  Heparin level (unfractionated)     Status: Abnormal   Collection Time: 06/05/14  6:10 AM  Result Value Ref Range   Heparin Unfractionated 0.21 (L) 0.30 - 0.70 IU/mL  CBC     Status: Abnormal   Collection Time: 06/05/14  6:16 AM  Result Value Ref Range   WBC 5.5 4.0 -  10.5 K/uL   RBC 4.10 (L) 4.22 - 5.81 MIL/uL   Hemoglobin 13.6 13.0 - 17.0 g/dL   HCT 19.1 47.8 - 29.5 %   MCV 95.4 78.0 - 100.0 fL   MCH 33.2 26.0 - 34.0 pg   MCHC 34.8 30.0 - 36.0 g/dL   RDW 62.1 30.8 - 65.7 %   Platelets 210 150 - 400 K/uL    Studies/Results: Dg Chest 2 View  06/04/2014   CLINICAL DATA:  Chest pain since 2 a.m. Patient awaken from sleep with chest pain.  EXAM: CHEST  2 VIEW  COMPARISON:  None.  FINDINGS: The cardiopericardial silhouette is mildly enlarged for projection. Diffuse interstitial prominence is present, which may be associated with emphysema. There may be some interstitial pulmonary edema superimposed. Bilateral pleural apical scarring is present. There is no focal airspace consolidation. Aortic arch atherosclerosis. No pleural effusion.  IMPRESSION: Cardiomegaly and diffuse interstitial prominence.  Interstitial prominence may be associated with emphysema in this cigarette smoker. Interstitial pulmonary edema may also produce this appearance.   Electronically Signed   By: Andreas NewportGeoffrey  Lamke M.D.   On: 06/04/2014 12:14    Medications: Reviewed    @PROBHOSP @   1  CP  Plan for cath today   Will need to review echo that was just done.     LOS: 1 day   Alex Mullins 06/05/2014, 12:25 PM

## 2014-06-05 NOTE — Progress Notes (Signed)
  Echocardiogram 2D Echocardiogram has been performed.  Leta JunglingCooper, Toi Stelly M 06/05/2014, 12:56 PM

## 2014-06-05 NOTE — Progress Notes (Signed)
ANTICOAGULATION CONSULT NOTE Pharmacy Consult for heparin Indication: chest pain/ACS  No Known Allergies  Patient Measurements: Height: 6' (182.9 cm) Weight: 199 lb 3.2 oz (90.357 kg) IBW/kg (Calculated) : 77.6  Vital Signs: Temp: 97.5 F (36.4 C) (04/20 0744) Temp Source: Oral (04/20 0744) BP: 129/74 mmHg (04/20 0744) Pulse Rate: 58 (04/20 0744)  Labs:  Recent Labs  06/04/14 1220 06/04/14 2129 06/05/14 0610 06/05/14 0616  HGB 14.5  --   --  13.6  HCT 41.7  --   --  39.1  PLT 219  --   --  210  LABPROT 13.1  --   --   --   INR 0.98  --   --   --   HEPARINUNFRC  --  0.31 0.21*  --   CREATININE 1.49*  --  1.50*  --   TROPONINI 0.03  --   --   --     Estimated Creatinine Clearance: 51 mL/min (by C-G formula based on Cr of 1.5).   Medical History: Past Medical History  Diagnosis Date  . COPD (chronic obstructive pulmonary disease)   . Hyperlipidemia   . Hypertension   . Vitamin D deficiency   . Carotid artery occlusion     Medications:  Prescriptions prior to admission  Medication Sig Dispense Refill Last Dose  . amLODipine (NORVASC) 5 MG tablet Take 5 mg by mouth daily.   06/04/2014 at Unknown time  . aspirin 325 MG tablet Take 325 mg by mouth daily.   06/04/2014 at Unknown time  . Cholecalciferol (VITAMIN D PO) Take 1 tablet by mouth daily.   06/04/2014 at Unknown time  . losartan (COZAAR) 100 MG tablet Take 100 mg by mouth daily.   06/04/2014 at Unknown time  . varenicline (CHANTIX) 1 MG tablet Take 1 mg by mouth 2 (two) times daily.   06/04/2014 at Unknown time      Assessment: 69 yo male admitted with bilateral arm pain and chest pain. Pt has history of HTN, smoking and PVD. Scr 1.49, eCrCl 52 ml/min, CBC stable and wnl.   Heparin level = 0.21 this AM  Goal of Therapy:  Heparin level 0.3-0.7 units/ml Monitor platelets by anticoagulation protocol: Yes   Plan:  Increase heparin to 1250 units / hr Follow up after cath  Thank you. Okey RegalLisa Matt Delpizzo,  PharmD [phone]  06/05/2014 9:05 AM

## 2014-06-05 NOTE — Progress Notes (Signed)
 Subjective:  Objective: Filed Vitals:   06/04/14 2351 06/05/14 0600 06/05/14 0744 06/05/14 0952  BP: 144/76 136/70 129/74 155/75  Pulse: 60 64 58 54  Temp: 97.7 F (36.5 C) 97.6 F (36.4 C) 97.5 F (36.4 C)   TempSrc: Oral Oral Oral   Resp: 18 18 16   Height:      Weight:  199 lb 3.2 oz (90.357 kg)    SpO2: 95% 92% 98%    Weight change:  No intake or output data in the 24 hours ending 06/05/14 1225   Exam  Patient being wheeled to cath lab   Not performed  Lab Results: Results for orders placed or performed during the hospital encounter of 06/04/14 (from the past 24 hour(s))  MRSA PCR Screening     Status: None   Collection Time: 06/04/14  4:28 PM  Result Value Ref Range   MRSA by PCR NEGATIVE NEGATIVE  Heparin level (unfractionated)     Status: None   Collection Time: 06/04/14  9:29 PM  Result Value Ref Range   Heparin Unfractionated 0.31 0.30 - 0.70 IU/mL  Basic metabolic panel     Status: Abnormal   Collection Time: 06/05/14  6:10 AM  Result Value Ref Range   Sodium 138 135 - 145 mmol/L   Potassium 4.3 3.5 - 5.1 mmol/L   Chloride 104 96 - 112 mmol/L   CO2 26 19 - 32 mmol/L   Glucose, Bld 104 (H) 70 - 99 mg/dL   BUN 13 6 - 23 mg/dL   Creatinine, Ser 1.50 (H) 0.50 - 1.35 mg/dL   Calcium 9.0 8.4 - 10.5 mg/dL   GFR calc non Af Amer 46 (L) >90 mL/min   GFR calc Af Amer 53 (L) >90 mL/min   Anion gap 8 5 - 15  Lipid panel     Status: Abnormal   Collection Time: 06/05/14  6:10 AM  Result Value Ref Range   Cholesterol 209 (H) 0 - 200 mg/dL   Triglycerides 183 (H) <150 mg/dL   HDL 33 (L) >39 mg/dL   Total CHOL/HDL Ratio 6.3 RATIO   VLDL 37 0 - 40 mg/dL   LDL Cholesterol 139 (H) 0 - 99 mg/dL  Heparin level (unfractionated)     Status: Abnormal   Collection Time: 06/05/14  6:10 AM  Result Value Ref Range   Heparin Unfractionated 0.21 (L) 0.30 - 0.70 IU/mL  CBC     Status: Abnormal   Collection Time: 06/05/14  6:16 AM  Result Value Ref Range   WBC 5.5 4.0 -  10.5 K/uL   RBC 4.10 (L) 4.22 - 5.81 MIL/uL   Hemoglobin 13.6 13.0 - 17.0 g/dL   HCT 39.1 39.0 - 52.0 %   MCV 95.4 78.0 - 100.0 fL   MCH 33.2 26.0 - 34.0 pg   MCHC 34.8 30.0 - 36.0 g/dL   RDW 12.7 11.5 - 15.5 %   Platelets 210 150 - 400 K/uL    Studies/Results: Dg Chest 2 View  06/04/2014   CLINICAL DATA:  Chest pain since 2 a.m. Patient awaken from sleep with chest pain.  EXAM: CHEST  2 VIEW  COMPARISON:  None.  FINDINGS: The cardiopericardial silhouette is mildly enlarged for projection. Diffuse interstitial prominence is present, which may be associated with emphysema. There may be some interstitial pulmonary edema superimposed. Bilateral pleural apical scarring is present. There is no focal airspace consolidation. Aortic arch atherosclerosis. No pleural effusion.  IMPRESSION: Cardiomegaly and diffuse interstitial prominence.   Interstitial prominence may be associated with emphysema in this cigarette smoker. Interstitial pulmonary edema may also produce this appearance.   Electronically Signed   By: Geoffrey  Lamke M.D.   On: 06/04/2014 12:14    Medications: Reviewed    @PROBHOSP@   1  CP  Plan for cath today   Will need to review echo that was just done.     LOS: 1 day   Alano Blasco 06/05/2014, 12:25 PM   

## 2014-06-06 ENCOUNTER — Encounter (HOSPITAL_COMMUNITY): Payer: Self-pay | Admitting: Physician Assistant

## 2014-06-06 ENCOUNTER — Telehealth: Payer: Self-pay | Admitting: Interventional Cardiology

## 2014-06-06 DIAGNOSIS — I2510 Atherosclerotic heart disease of native coronary artery without angina pectoris: Secondary | ICD-10-CM

## 2014-06-06 DIAGNOSIS — Z9861 Coronary angioplasty status: Secondary | ICD-10-CM

## 2014-06-06 DIAGNOSIS — J439 Emphysema, unspecified: Secondary | ICD-10-CM

## 2014-06-06 LAB — BASIC METABOLIC PANEL
ANION GAP: 8 (ref 5–15)
BUN: 11 mg/dL (ref 6–23)
CALCIUM: 8.6 mg/dL (ref 8.4–10.5)
CO2: 25 mmol/L (ref 19–32)
CREATININE: 1.43 mg/dL — AB (ref 0.50–1.35)
Chloride: 104 mmol/L (ref 96–112)
GFR calc non Af Amer: 48 mL/min — ABNORMAL LOW (ref >90)
GFR, EST AFRICAN AMERICAN: 56 mL/min — AB (ref >90)
Glucose, Bld: 106 mg/dL — ABNORMAL HIGH (ref 70–99)
Potassium: 4 mmol/L (ref 3.5–5.1)
SODIUM: 137 mmol/L (ref 135–145)

## 2014-06-06 LAB — CBC
HCT: 35.8 % — ABNORMAL LOW (ref 39.0–52.0)
HEMOGLOBIN: 12.5 g/dL — AB (ref 13.0–17.0)
MCH: 33.1 pg (ref 26.0–34.0)
MCHC: 34.9 g/dL (ref 30.0–36.0)
MCV: 94.7 fL (ref 78.0–100.0)
Platelets: 195 10*3/uL (ref 150–400)
RBC: 3.78 MIL/uL — ABNORMAL LOW (ref 4.22–5.81)
RDW: 12.6 % (ref 11.5–15.5)
WBC: 6.9 10*3/uL (ref 4.0–10.5)

## 2014-06-06 MED ORDER — TICAGRELOR 90 MG PO TABS: 90.0000 mg | ORAL_TABLET | Freq: Two times a day (BID) | ORAL | Status: DC

## 2014-06-06 MED ORDER — ASPIRIN 81 MG PO TABS: 81.0000 mg | ORAL_TABLET | Freq: Every day | ORAL | Status: DC

## 2014-06-06 MED ORDER — METOPROLOL TARTRATE 25 MG PO TABS: 12.5000 mg | ORAL_TABLET | Freq: Two times a day (BID) | ORAL | Status: DC

## 2014-06-06 MED ORDER — ATORVASTATIN CALCIUM 80 MG PO TABS: 80.0000 mg | ORAL_TABLET | Freq: Every day | ORAL | Status: DC

## 2014-06-06 MED ORDER — PANTOPRAZOLE SODIUM 40 MG PO TBEC: 40.0000 mg | DELAYED_RELEASE_TABLET | Freq: Every day | ORAL | Status: DC

## 2014-06-06 MED ORDER — NITROGLYCERIN 0.4 MG/SPRAY TL SOLN: 1.0000 | Status: AC | PRN

## 2014-06-06 MED FILL — Sodium Chloride IV Soln 0.9%: INTRAVENOUS | Qty: 50 | Status: AC

## 2014-06-06 NOTE — Progress Notes (Addendum)
CARDIAC REHAB PHASE I   PRE:  Rate/Rhythm: 63 SR  BP:  Sitting: 128/60        SaO2: 97 RA  MODE:  Ambulation: 700 ft   POST:  Rate/Rhythm: 73 SR  BP:  Sitting: 152/62         SaO2: 99 RA  Pt ambulated 700  ft on RA , handheld assist, steady gait, tolerated fair.  Pt denies cp, dizziness, SOB. Standing rest x 1.  Pt activity is limited somewhat d/t hx pvd. Pt states his "legs hurt, like a burning."  Improved with rest. VSS. Completed stent education.  Reviewed anti-platelet therapy, stent card, activity restrictions, ntg, risk factors, tobacco cessation, exercise, heart healthy diet, sodium recommendation, phase 2 cardiac rehab. Pt verbalized understanding. Pt states he is a "couch potato and knows he needs to do better." Pt wife at bedside during education. CM to see pt regarding brilinta copay.  Pt states he is "going to try to quit smoking." Pt agrees to phase 2 cardiac rehab. [facility]  Pt returned to chair, call bell within reach.   [phone]  Joylene GrapesMonge, Paullette Mckain C, RN, BSN 06/06/2014 10:09 AM

## 2014-06-06 NOTE — Discharge Instructions (Signed)
PLEASE REMEMBER TO BRING ALL OF YOUR MEDICATIONS TO EACH OF YOUR FOLLOW-UP OFFICE VISITS. ° °PLEASE ATTEND ALL SCHEDULED FOLLOW-UP APPOINTMENTS.  ° °Activity: Increase activity slowly as tolerated. You may shower, but no soaking baths (or swimming) for 1 week. No driving for 2 days. No lifting over 5 lbs for 1 week. No sexual activity for 1 week.  ° °You May Return to Work: in 1 week (if applicable) ° °Wound Care: You may wash cath site gently with soap and water. Keep cath site clean and dry. If you notice pain, swelling, bleeding or pus at your cath site, please call [phone]. ° ° ° °Cardiac Cath Site Care °Refer to this sheet in the next few weeks. These instructions provide you with information on caring for yourself after your procedure. Your caregiver may also give you more specific instructions. Your treatment has been planned according to current medical practices, but problems sometimes occur. Call your caregiver if you have any problems or questions after your procedure. °HOME CARE INSTRUCTIONS °· You may shower 24 hours after the procedure. Remove the bandage (dressing) and gently wash the site with plain soap and water. Gently pat the site dry.  °· Do not apply powder or lotion to the site.  °· Do not sit in a bathtub, swimming pool, or whirlpool for 5 to 7 days.  °· No bending, squatting, or lifting anything over 10 pounds (4.5 kg) as directed by your caregiver.  °· Inspect the site at least twice daily.  °· Do not drive home if you are discharged the same day of the procedure. Have someone else drive you.  °· You may drive 24 hours after the procedure unless otherwise instructed by your caregiver.  °What to expect: °· Any bruising will usually fade within 1 to 2 weeks.  °· Blood that collects in the tissue (hematoma) may be painful to the touch. It should usually decrease in size and tenderness within 1 to 2 weeks.  °SEEK IMMEDIATE MEDICAL CARE IF: °· You have unusual pain at the site or down the  affected limb.  °· You have redness, warmth, swelling, or pain at the site.  °· You have drainage (other than a small amount of blood on the dressing).  °· You have chills.  °· You have a fever or persistent symptoms for more than 72 hours.  °· You have a fever and your symptoms suddenly get worse.  °· Your leg becomes pale, cool, tingly, or numb.  °· You have heavy bleeding from the site. Hold pressure on the site.  °Document Released: 03/06/2010 Document Revised: 01/21/2011 Document Reviewed: 03/06/2010 °ExitCare® Patient Information ©2012 [employer]. ° °

## 2014-06-06 NOTE — Progress Notes (Signed)
Patient Name: Alex Mullins Date of Encounter: 06/06/2014  Principal Problem:   Unstable angina Active Problems:   Carotid stenosis- moderate, followed by Dr Edilia Bo   Essential hypertension   Renal insufficiency- ? chronic   FM Hx of CAD- F - died 80 MI   RBBB   COPD with emphysema   Smoker   Claudication of lower extremity   Primary Cardiologist: Dr Eldridge Dace  Patient Profile: 69 yo male with a history of HTN, COPD, smoking, and PVD. Admitted 04/19 for chest pain. S/p cath w/ DES x 2 RCA  SUBJECTIVE: No more chest pain, feels much better. On Chantix to quit tobacco   OBJECTIVE Filed Vitals:   06/05/14 2016 06/06/14 0040 06/06/14 0500 06/06/14 0655  BP: 95/39 123/52  143/74  Pulse: 58 65  62  Temp: 98.7 F (37.1 C) 98 F (36.7 C)  97.7 F (36.5 C)  TempSrc: Axillary Oral  Oral  Resp: Height:      Weight:  200 lb 9.9 oz (91 kg) 200 lb 9.9 oz (91 kg)   SpO2: 97% 96%  97%    Intake/Output Summary (Last 24 hours) at 06/06/14 0725 Last data filed at 06/06/14 0630  Gross per 24 hour  Intake    380 ml  Output    600 ml  Net   -220 ml   Filed Weights   06/05/14 0600 06/06/14 0040 06/06/14 0500  Weight: 199 lb 3.2 oz (90.357 kg) 200 lb 9.9 oz (91 kg) 200 lb 9.9 oz (91 kg)    PHYSICAL EXAM General: Well developed, well nourished, male in no acute distress. Head: Normocephalic, atraumatic.  Neck: Supple without bruits, JVD not elevated. Lungs:  Resp regular and unlabored, few rales, slight upper airway wheeze. Heart: RRR, S1, S2, no S3, S4, or murmur; no rub. Abdomen: Soft, non-tender, non-distended, BS + x 4.  Extremities: No clubbing, cyanosis, no edema. R radial cath site w/out ecchymosis or hematoma Neuro: Alert and oriented X 3. Moves all extremities spontaneously. Psych: Normal affect.  LABS: CBC: Recent Labs  06/05/14 0616 06/06/14 0555  WBC 5.5 6.9  HGB 13.6 12.5*  HCT 39.1 35.8*  MCV 95.4 94.7  PLT 210 195   INR: Recent  Labs  06/04/14 1220  INR 0.98   Basic Metabolic Panel: Recent Labs  06/04/14 1220 06/05/14 0610 06/06/14 0555  NA 136 138 137  K 4.4 4.3 4.0  CL 101 104 104  CO2 GLUCOSE 128* 104* 106*  BUN CREATININE 1.49* 1.50* 1.43*  CALCIUM 10.1 9.0 8.6  MG 1.9  --   --    Liver Function Tests: Recent Labs  06/04/14 1220  AST 22  ALT 32  ALKPHOS 77  BILITOT 0.8  PROT 7.0  ALBUMIN 3.9   Cardiac Enzymes: Recent Labs  06/04/14 1220  TROPONINI 0.03   Fasting Lipid Panel: Recent Labs  06/05/14 0610  CHOL 209*  HDL 33*  LDLCALC 139*  TRIG 183*  CHOLHDL 6.3    TELE:   SR, occ PVCs and pairs    ECG: SR, RBBB  Coronary angiography: Coronary dominance: right    Left Main: Normal  Left Anterior Descending (LAD): Normal in size and heavily calcified. It is 20% proximal stenosis. There is 60% stenosis in the midsegment at the origin of the first septal perforator. The rest of the vessel has 20% mid stenosis.  1st diagonal (D1): Normal in  size with 30% proximal stenosis.  2nd diagonal (D2): Very small in size.  3rd diagonal (D3): Very small in size.  Circumflex (LCx): Normal in size and nondominant. There is 20% diffuse stenosis in the midsegment.  1st obtuse marginal: Medium in size with minor irregularities.  2nd obtuse marginal: Normal in size with diffuse 20% proximal disease.  3rd obtuse marginal: Normal in size with minor irregularities.   Right Coronary Artery: Heavily calcified with 30% proximal stenosis. There is 99% stenosis in the midsegment with diffuse 80% disease throughout the whole midsegment following that. The vessel has TIMI 2 flow.   Posterior descending artery: Normal   Posterior AV segment: Normal Posterolateral branchs: Normal  Left ventriculography: Was not performed due to chronic kidney disease. EF was normal by echo.  PCI Note: Following the diagnostic procedure, the decision was made  to proceed with PCI. Weight-based bivalirudin was given for anticoagulation. Once a therapeutic ACT was achieved, a 6 [place] JR4 guide catheter was inserted. A intuition coronary guidewire was used to cross the lesion. The lesion was predilated with a 2.0 x 20 balloon. I could not deliver a stent due to heavy calcifications. Thus, I used a 2.75 noncompliant balloon to predilate the lesion with full expansion. In spite of this, I was not able to deliver the stent. Thus, I used a run through wire as a buddy wire. Throughout these manipulations, the patient became bradycardic and required 0.5 mg of atropine with resolution of bradycardia. I was then able to deliver a 3.0 x 38 mm Promus drug-eluting stent. This was not long enough to cover the whole lesion. I used a 3.5 x 20 mm Promus drug-eluting stent to overlap proximally. The stent was postdilated with a 3.75 noncompliant balloon. Following PCI, there was 0% residual stenosis and TIMI-3 flow. Final angiography confirmed an excellent result. The patient tolerated the procedure well. There were no immediate procedural complications. A TR band was used for radial hemostasis. The patient was transferred to the post catheterization recovery area for further monitoring.  PCI Data: Vessel - RCA /Segment - mid  Percent Stenosis (pre) 99%  TIMI-flow 2  Stent : 3.0 x 38 mm overlapped with a 3.5 x 20 mm Promus drug-eluting stents  Percent Stenosis (post) 0%  TIMI-flow (post) 3  Final Conclusions:  1. Severe one-vessel coronary artery disease with heavily calcified mid RCA stenosis. There is also moderate mid LAD stenosis. The coronary arteries are overall heavily calcified. 2. Normal LV systolic function by echo. Mildly elevated left ventricular end-diastolic pressure. 3. Successful angioplasty and drug-eluting stent placement 2 to the right coronary artery. Recommendations:  Dual antiplatelet therapy for at least one year. Aggressive  treatment of risk factors and smoking cessation are highly recommended.  Radiology/Studies: Dg Chest 2 View  06/04/2014   CLINICAL DATA:  Chest pain since 2 a.m. Patient awaken from sleep with chest pain.  EXAM: CHEST  2 VIEW  COMPARISON:  None.  FINDINGS: The cardiopericardial silhouette is mildly enlarged for projection. Diffuse interstitial prominence is present, which may be associated with emphysema. There may be some interstitial pulmonary edema superimposed. Bilateral pleural apical scarring is present. There is no focal airspace consolidation. Aortic arch atherosclerosis. No pleural effusion.  IMPRESSION: Cardiomegaly and diffuse interstitial prominence. Interstitial prominence may be associated with emphysema in this cigarette smoker. Interstitial pulmonary edema may also produce this appearance.   Electronically Signed   By: Andreas Newport M.D.   On: 06/04/2014 12:14     Current Medications:  .  amLODipine  5 mg Oral Daily  . aspirin EC  81 mg Oral Daily  . atorvastatin  80 mg Oral q1800  . metoprolol tartrate  12.5 mg Oral BID  . nitroGLYCERIN  0.5 inch Topical 4 times per day  . pantoprazole  40 mg Oral Q0600  . ticagrelor  90 mg Oral BID  . varenicline  1 mg Oral BID      ASSESSMENT AND PLAN: Principal Problem:   Unstable angina - s/p DES x 2 RCA - on ASA, BB, statin.  - will d/c NTG paste - cardiac rehab  - HH lifestyle changes encouraged - d/c today    CKD stage III - cr stable after cath  Otherwise, continue current rx Active Problems:   Carotid stenosis- moderate, followed by Dr Edilia Boickson   Essential hypertension   Renal insufficiency- ? chronic   FM Hx of CAD- F - died 2656 MI   RBBB   COPD with emphysema   Smoker   Claudication of lower extremity  Plan: ambulate & see how tolerated, d/c today   Signed, Theodore DemarkRhonda Barrett , PA-C 7:25 AM 06/06/2014   I have seen, examined and evaluated the patient this AM along with Theodore Demarkhonda Barrett, PA-C.  After reviewing  all the available data and chart,  I agree with her findings, examination as well as impression recommendations.  Doing well post PCI.  No CP/SOB. Exam is stable.  BP ~stable.   DAPT x 1 yr.   On low dose BB - but HR dropped to 40s o/n - not able to give.  Restart home CCB as BB may not be a valid option.    Marykay LexHARDING, Bently Wyss W, M.D., M.S. Interventional Cardiologist   Pager # [phone]

## 2014-06-06 NOTE — Telephone Encounter (Signed)
New Message   Appt scheduled for a TCM per PA-C Theodore Demarkhonda Barrett, Patient had a cath with PCI. Appt is scheduled for 06/18/2014 @ 2pm.  Patient has been called and notified.

## 2014-06-06 NOTE — Discharge Summary (Signed)
CARDIOLOGY DISCHARGE SUMMARY   Patient ID: Alex Mullins MRN: [id number] DOB/AGE: 1945/04/11 69 y.o.  AdElio Mullins date: 06/04/2014 Discharge date: 06/06/2014  PCP: Mathis FareWeeks, Susan Ramsey, NP Primary Cardiologist: Dr. Abe PeopleVaransi  Primary Discharge Diagnosis:  Unstable angina Secondary Discharge Diagnosis:    Carotid stenosis- moderate, followed by Dr Edilia Boickson   Essential hypertension   Renal insufficiency- ? chronic   FM Hx of CAD- F - died 1256 MI   RBBB   COPD with emphysema   Smoker   Claudication of lower extremity   RBBB  Procedures: Left Heart Cath, Selective Coronary Angiography, PTCA and stenting of the mid RCA X 2, 2-D echocardiogram  Hospital Course: Alex ForgetCharles Mullins is a 69 y.o. male with a history of PAD. He was wakened by chest pain on the day of admission and came to the hospital, where he was admitted for further evaluation and treatment.  Cardiac enzymes were negative for MI. He had recurrent pain with minimal ambulation. His symptoms were concerning for unstable anginal pain and he was taken to the cath lab on 06/05/2014. Because of the need for cardiac catheterization, his home dose of Cozaar was held.  Cardiac catheterization results are below. He had 2 drug-eluting stents to the RCA. He has minimal/moderate disease in other vessels for which medical therapy is recommended. Because of his renal insufficiency, no left ventriculogram was performed. A 2-D echocardiogram was performed, results below. His EF is preserved. A lipid profile was performed, results are below. He has significant hyperlipidemia and was started on a statin. The need for smoking cessation was reinforced and he is to continue Chantix.  On 06/06/2014, he was seen by Dr. Herbie BaltimoreHarding and all data were reviewed. He was started on a low dose of metoprolol and tolerated this well. His blood pressure was well-controlled on current medications, so his Cozaar will be held for now.   He was seen by cardiac  rehabilitation and educated on heart-healthy lifestyle modifications, stent restrictions and exercise guidelines. He was ambulating without chest pain or shortness of breath and considered stable for discharge, to follow-up as an outpatient.  Labs:   Lab Results  Component Value Date   WBC 6.9 06/06/2014   HGB 12.5* 06/06/2014   HCT 35.8* 06/06/2014   MCV 94.7 06/06/2014   PLT 195 06/06/2014    Recent Labs Lab 06/04/14 1220  06/06/14 0555  NA 136  < > 137  K 4.4  < > 4.0  CL 101  < > 104  CO2 27  < > 25  BUN 12  < > 11  CREATININE 1.49*  < > 1.43*  CALCIUM 10.1  < > 8.6  PROT 7.0  --   --   BILITOT 0.8  --   --   ALKPHOS 77  --   --   ALT 32  --   --   AST 22  --   --   GLUCOSE 128*  < > 106*  < > = values in this interval not displayed.  Recent Labs  06/04/14 1220  TROPONINI 0.03   Lipid Panel     Component Value Date/Time   CHOL 209* 06/05/2014 0610   TRIG 183* 06/05/2014 0610   HDL 33* 06/05/2014 0610   CHOLHDL 6.3 06/05/2014 0610   VLDL 37 06/05/2014 0610   LDLCALC 139* 06/05/2014 0610    Recent Labs  06/04/14 1220  INR 0.98      Radiology: Dg Chest 2 View 06/04/2014  CLINICAL DATA:  Chest pain since 2 a.m. Patient awaken from sleep with chest pain.  EXAM: CHEST  2 VIEW  COMPARISON:  None.  FINDINGS: The cardiopericardial silhouette is mildly enlarged for projection. Diffuse interstitial prominence is present, which may be associated with emphysema. There may be some interstitial pulmonary edema superimposed. Bilateral pleural apical scarring is present. There is no focal airspace consolidation. Aortic arch atherosclerosis. No pleural effusion.  IMPRESSION: Cardiomegaly and diffuse interstitial prominence. Interstitial prominence may be associated with emphysema in this cigarette smoker. Interstitial pulmonary edema may also produce this appearance.   Electronically Signed   By: Andreas Newport M.D.   On: 06/04/2014 12:14    Cardiac Cath:  06/05/2014 Coronary dominance: right   Left Main: Normal  Left Anterior Descending (LAD): Normal in size and heavily calcified. It is 20% proximal stenosis. There is 60% stenosis in the midsegment at the origin of the first septal perforator. The rest of the vessel has 20% mid stenosis.  1st diagonal (D1): Normal in size with 30% proximal stenosis.  2nd diagonal (D2): Very small in size.  3rd diagonal (D3): Very small in size.  Circumflex (LCx): Normal in size and nondominant. There is 20% diffuse stenosis in the midsegment.  1st obtuse marginal: Medium in size with minor irregularities.  2nd obtuse marginal: Normal in size with diffuse 20% proximal disease.  3rd obtuse marginal: Normal in size with minor irregularities.  Right Coronary Artery: Heavily calcified with 30% proximal stenosis. There is 99% stenosis in the midsegment with diffuse 80% disease throughout the whole midsegment following that. The vessel has TIMI 2 flow.   Posterior descending artery: Normal   Posterior AV segment: Normal Posterolateral branchs: Normal  Left ventriculography: Was not performed due to chronic kidney disease. EF was normal by echo.  PCI Data: Vessel - RCA /Segment - mid  Percent Stenosis (pre) 99%  TIMI-flow 2  Stent : 3.0 x 38 mm overlapped with a 3.5 x 20 mm Promus drug-eluting stents  Percent Stenosis (post) 0%  TIMI-flow (post) 3 Final Conclusions:  1. Severe one-vessel coronary artery disease with heavily calcified mid RCA stenosis. There is also moderate mid LAD stenosis. The coronary arteries are overall heavily calcified. 2. Normal LV systolic function by echo. Mildly elevated left ventricular end-diastolic pressure. 3. Successful angioplasty and drug-eluting stent placement 2 to the right coronary artery. Recommendations:  Dual antiplatelet therapy for at least one year. Aggressive treatment of risk factors and smoking cessation are highly  recommended.  EKG: Sinus bradycardia, right bundle branch block  Echo: 06/05/2014 Conclusions Study Conclusions - Left ventricle: The cavity size was normal. Wall thickness was increased in a pattern of mild LVH. Systolic function was normal. The estimated ejection fraction was in the range of 60% to 65%. Akinesis of the basalinferolateral myocardium. - Pericardium, extracardiac: A trivial pericardial effusion was identified.  FOLLOW UP PLANS AND APPOINTMENTS No Known Allergies   Medication List    STOP taking these medications        losartan 100 MG tablet  Commonly known as:  COZAAR      TAKE these medications        amLODipine 5 MG tablet  Commonly known as:  NORVASC  Take 5 mg by mouth daily.     aspirin 81 MG tablet  Take 1 tablet (81 mg total) by mouth daily.     atorvastatin 80 MG tablet  Commonly known as:  LIPITOR  Take 1 tablet (80 mg total) by mouth  daily at 6 PM.     metoprolol tartrate 25 MG tablet  Commonly known as:  LOPRESSOR  Take 0.5 tablets (12.5 mg total) by mouth 2 (two) times daily.     nitroGLYCERIN 0.4 MG/SPRAY spray  Commonly known as:  NITROLINGUAL  Place 1 spray under the tongue every 5 (five) minutes x 3 doses as needed for chest pain.     pantoprazole 40 MG tablet  Commonly known as:  PROTONIX  Take 1 tablet (40 mg total) by mouth daily at 6 (six) AM.     ticagrelor 90 MG Tabs tablet  Commonly known as:  BRILINTA  Take 1 tablet (90 mg total) by mouth 2 (two) times daily.     varenicline 1 MG tablet  Commonly known as:  CHANTIX  Take 1 mg by mouth 2 (two) times daily.     VITAMIN D PO  Take 1 tablet by mouth daily.        Discharge Instructions    Diet - low sodium heart healthy    Complete by:  As directed      Increase activity slowly    Complete by:  As directed           Follow-up Information    Follow up with Corky Crafts., MD.   Specialty:  Interventional Cardiology   Why:  The office will  call.   Contact information:   [address] [place] [address] [phone]       BRING ALL MEDICATIONS WITH YOU TO FOLLOW UP APPOINTMENTS  Time spent with patient to include physician time: 48 min Signed: Theodore Demark, PA-C 06/06/2014, 9:48 AM Co-Sign MD   Patient seen & examined this AM & chart/data reviewed.  D/c plans discussed with Ms. Raford Pitcher, PA.  I agree with the summary. See final PN for details.  Doing well post PCI. No CP/SOB. Exam is stable.  BP ~stable.   DAPT x 1 yr. On low dose BB - but HR dropped to 40s o/n - not able to give. Restart home CCB as BB may not be a valid option.   Marykay Lex, M.D., M.S. Interventional Cardiologist   Pager # [phone]

## 2014-06-07 NOTE — Telephone Encounter (Signed)
Left message to call back  

## 2014-06-07 NOTE — Telephone Encounter (Signed)
[facility]. Patient understands to follow up with Norma FredricksonLori Gerhardt [facility]   Patient understands medications and regiment? Yes--reviewed Patient understands to bring all medications to this visit? Yes    According to DC note: Patient seen & examined this AM & chart/data reviewed. D/c plans discussed with Ms. Raford PitcherBarrett, PA.  I agree with the summary. See final PN for details.  Doing well post PCI. No CP/SOB. Exam is stable.  BP ~stable.   DAPT x 1 yr. On low dose BB - but HR dropped to 40s o/n - not able to give. Restart home CCB as BB may not be a valid option.   Marykay LexHARDING, DAVID W, M.D., M.S. Interventional Cardiologist  Patient seen & examined this AM & chart/data reviewed. D/c plans discussed with Ms. Raford PitcherBarrett, PA.  I agree with the summary. See final PN for details.  Doing well post PCI. No CP/SOB. Exam is stable.  BP ~stable.   DAPT x 1 yr. On low dose BB - but HR dropped to 40s o/n - not able to give. Restart home CCB as BB may not be a valid option.   Marykay LexHARDING, DAVID W, M.D., M.S. Interventional Cardiologist  PT CONTINUES TAKING METOPROLOL 12.5 MG TWICE DAILY.  TODAY HIS HR WAS 59, BP 136/68.  ADVISED PATIENT TO KEEP A RECORD OF DAILY BP AND HR AND BRING TO APPOINTMENT WITH MEDICATION BOTTLES.  ALSO ADVISED THAT IF HR WOULD RUN IN MID 50S CONSISTENTLY (FOR 2-3 DAYS) HE SHOULD CALL OFFICE TO INFORM.  PT VERBALIZES UNDERSTANDING AND AGREEMENT.  ALSO PT REPORTS A RAISED ITCHY RASH (BIG WELTS) ON HIS INNER THIGHS AND FACE LAST NIGHT THAT HAS GONE AWAY AS OF TODAY.  ADVISED HIM TO MAKE NO CHANGES AT THIS TIME.  IF RASH RETURNS, MAY CONSIDER IT IS ONE OF HIS NEW MEDICATIONS BUT ONLY IF IT RETURNS.

## 2014-06-18 ENCOUNTER — Encounter: Payer: Self-pay | Admitting: Nurse Practitioner

## 2014-06-18 ENCOUNTER — Ambulatory Visit (INDEPENDENT_AMBULATORY_CARE_PROVIDER_SITE_OTHER): Payer: Medicare Other | Admitting: Nurse Practitioner

## 2014-06-18 VITALS — BP 148/60 | HR 78 | Ht 72.0 in | Wt 199.8 lb

## 2014-06-18 DIAGNOSIS — Z955 Presence of coronary angioplasty implant and graft: Secondary | ICD-10-CM | POA: Diagnosis not present

## 2014-06-18 DIAGNOSIS — E785 Hyperlipidemia, unspecified: Secondary | ICD-10-CM

## 2014-06-18 DIAGNOSIS — Z720 Tobacco use: Secondary | ICD-10-CM

## 2014-06-18 DIAGNOSIS — F17210 Nicotine dependence, cigarettes, uncomplicated: Secondary | ICD-10-CM

## 2014-06-18 DIAGNOSIS — I739 Peripheral vascular disease, unspecified: Secondary | ICD-10-CM

## 2014-06-18 DIAGNOSIS — I259 Chronic ischemic heart disease, unspecified: Secondary | ICD-10-CM

## 2014-06-18 DIAGNOSIS — I10 Essential (primary) hypertension: Secondary | ICD-10-CM

## 2014-06-18 LAB — CBC
HCT: 39.6 % (ref 39.0–52.0)
Hemoglobin: 13.8 g/dL (ref 13.0–17.0)
MCHC: 35 g/dL (ref 30.0–36.0)
MCV: 94 fl (ref 78.0–100.0)
Platelets: 263 10*3/uL (ref 150.0–400.0)
RBC: 4.21 Mil/uL — ABNORMAL LOW (ref 4.22–5.81)
RDW: 13.1 % (ref 11.5–15.5)
WBC: 8.1 10*3/uL (ref 4.0–10.5)

## 2014-06-18 LAB — BASIC METABOLIC PANEL
BUN: 14 mg/dL (ref 6–23)
CO2: 29 mEq/L (ref 19–32)
Calcium: 9.3 mg/dL (ref 8.4–10.5)
Chloride: 100 mEq/L (ref 96–112)
Creatinine, Ser: 1.56 mg/dL — ABNORMAL HIGH (ref 0.40–1.50)
GFR: 47.04 mL/min — ABNORMAL LOW (ref >60.00)
Glucose, Bld: 122 mg/dL — ABNORMAL HIGH (ref 70–99)
Potassium: 3.6 mEq/L (ref 3.5–5.1)
Sodium: 135 mEq/L (ref 135–145)

## 2014-06-18 LAB — HEPATIC FUNCTION PANEL
ALT: 15 U/L (ref 0–53)
AST: 12 U/L (ref 0–37)
Albumin: 3.9 g/dL (ref 3.5–5.2)
Alkaline Phosphatase: 96 U/L (ref 39–117)
Bilirubin, Direct: 0.2 mg/dL (ref 0.0–0.3)
Total Bilirubin: 0.8 mg/dL (ref 0.2–1.2)
Total Protein: 7.5 g/dL (ref 6.0–8.3)

## 2014-06-18 MED ORDER — TICAGRELOR 90 MG PO TABS: 90.0000 mg | ORAL_TABLET | Freq: Two times a day (BID) | ORAL | Status: DC

## 2014-06-18 MED ORDER — ATORVASTATIN CALCIUM 80 MG PO TABS: 80.0000 mg | ORAL_TABLET | Freq: Every day | ORAL | Status: DC

## 2014-06-18 NOTE — Patient Instructions (Signed)
We will be checking the following labs today - BMET, CBC, HPF   Medication Instructions:    Continue with your current medicines.   I have sent a refill in for your Brilinta    Testing/Procedures To Be Arranged:  N/A  Follow-Up:   See Dr. Eldridge DaceVaranasi in 6 weeks with fasting labs    Other Special Instructions:   Rip HarbourOk to resume driving  Keep working on stopping smoking  Call [facility] office at [phone] if you have any questions, problems or concerns.

## 2014-06-18 NOTE — Progress Notes (Signed)
CARDIOLOGY OFFICE NOTE  Date:  06/18/2014    Alex Mullins Date of Birth: 01-Dec-1945 Medical Record #[id number]  PCP:  Mathis Fare, NP  Cardiologist:  Eldridge Dace  Chief Complaint  Patient presents with  . Post PCI - 2 stents to RCA    Seen for Dr. Eldridge Dace     History of Present Illness: Alex Mullins is a 69 y.o. male who presents today for a post cath/post hospital visit - seen for Dr. Eldridge Dace. He has known PAD with carotid disease - followed by Dr. Edilia Bo. Other issues include HTN, CKD, RBBB, COPD, ongoing tobacco abuse and claudication.  Presented last month to the hospital with chest pain - was cathed and had stenting of the mid RCX x 2 with drug-eluting stents.  He has minimal/moderate disease in other vessels for which medical therapy is recommended. Because of his renal insufficiency, no left ventriculogram was performed. A 2-D echocardiogram was performed, results below. His EF is preserved.  He has significant hyperlipidemia and was started on a statin. The need for smoking cessation was reinforced and he is to continue Chantix.   Comes in today. Here with his wife. Doing ok. Was smoking 2 1/2 packs prior - now down to just 4 or 5 cigs per day. No chest pain. Feels better but gets a little "sick feeling" after taking his medicines. Has not missed any doses. Not dizzy or lightheaded. BP is better at home. He has a tendency to have white coat syndrome. Wife feels like he is doing well.  Past Medical History  Diagnosis Date  . COPD (chronic obstructive pulmonary disease)   . Hyperlipidemia   . Hypertension   . Vitamin D deficiency   . Carotid artery occlusion   . Unstable angina 06/04/2014    DES 2 to the RCA  . RBBB (right bundle branch block)     Past Surgical History  Procedure Laterality Date  . Hernia repair    . Inguinal hernia repair  2008    right  . Spine surgery  1982  . Left heart catheterization with coronary angiogram N/A 06/05/2014   Procedure: LEFT HEART CATHETERIZATION WITH CORONARY ANGIOGRAM;  Surgeon: Iran Ouch, MD; LAD 20/60/20%, D1 30%, CFX 20%, OM to 20%, RCA 99/80%>>0% w/  3.0 x 38 mm overlapped with a 3.5 x 20 mm Promus DES  . Coronary stent placement  06/05/2014    3.0 x 38 mm overlapped with a 3.5 x 20 mm Promus DES to the RCA     Medications: Current Outpatient Prescriptions  Medication Sig Dispense Refill  . amLODipine (NORVASC) 5 MG tablet Take 5 mg by mouth daily.    Marland Kitchen aspirin 81 MG tablet Take 1 tablet (81 mg total) by mouth daily.    Marland Kitchen atorvastatin (LIPITOR) 80 MG tablet Take 1 tablet (80 mg total) by mouth at bedtime. 30 tablet 11  . Cholecalciferol (VITAMIN D PO) Take 1 tablet by mouth daily.    . metoprolol tartrate (LOPRESSOR) 25 MG tablet Take 0.5 tablets (12.5 mg total) by mouth 2 (two) times daily. 60 tablet 11  . nitroGLYCERIN (NITROLINGUAL) 0.4 MG/SPRAY spray Place 1 spray under the tongue every 5 (five) minutes x 3 doses as needed for chest pain. 12 g 12  . pantoprazole (PROTONIX) 40 MG tablet Take 1 tablet (40 mg total) by mouth daily at 6 (six) AM. 30 tablet 11  . ticagrelor (BRILINTA) 90 MG TABS tablet Take 1 tablet (90 mg total) by  mouth 2 (two) times daily. 60 tablet 11  . varenicline (CHANTIX) 1 MG tablet Take 1 mg by mouth 2 (two) times daily.     No current facility-administered medications for this visit.    Allergies: No Known Allergies  Social History: The patient  reports that he has been smoking Cigarettes.  He has a 50 pack-year smoking history. He has never used smokeless tobacco. He reports that he drinks about 3.0 oz of alcohol per week. He reports that he does not use illicit drugs.   Family History: The patient's family history includes Cancer (age of onset: 18) in his sister; Heart attack in his father; Heart disease in his father; Hypertension in his mother.   Review of Systems: Please see the history of present illness.   All other systems are reviewed and  negative.   Physical Exam: VS:  BP 148/60 mmHg  Pulse 78  Ht 6' (1.829 m)  Wt 199 lb 12.8 oz (90.629 kg)  BMI 27.09 kg/m2  SpO2 97% .  BMI Body mass index is 27.09 kg/(m^2).  Wt Readings from Last 3 Encounters:  06/18/14 199 lb 12.8 oz (90.629 kg)  06/06/14 200 lb 9.9 oz (91 kg)  12/19/13 203 lb 6.4 oz (92.262 kg)    General: Pleasant. Well developed, well nourished and in no acute distress.  HEENT: Normal. Neck: Supple, no JVD, carotid bruits, or masses noted.  Cardiac: Regular rate and rhythm. No murmurs, rubs, or gallops. No edema.  Respiratory:  Lungs are clear to auscultation bilaterally with normal work of breathing.  GI: Soft and nontender.  MS: No deformity or atrophy. Gait and ROM intact. Skin: Warm and dry. Color is normal.  Neuro:  Strength and sensation are intact and no gross focal deficits noted.  Psych: Alert, appropriate and with normal affect.   LABORATORY DATA:  EKG:  EKG is not ordered today.  Lab Results  Component Value Date   WBC 6.9 06/06/2014   HGB 12.5* 06/06/2014   HCT 35.8* 06/06/2014   PLT 195 06/06/2014   GLUCOSE 106* 06/06/2014   CHOL 209* 06/05/2014   TRIG 183* 06/05/2014   HDL 33* 06/05/2014   LDLCALC 139* 06/05/2014   ALT 32 06/04/2014   AST 22 06/04/2014   NA 137 06/06/2014   K 4.0 06/06/2014   CL 104 06/06/2014   CREATININE 1.43* 06/06/2014   BUN 11 06/06/2014   CO2 25 06/06/2014   INR 0.98 06/04/2014    BNP (last 3 results) No results for input(s): BNP in the last 8760 hours.  ProBNP (last 3 results) No results for input(s): PROBNP in the last 8760 hours.   Other Studies Reviewed Today:  Cardiac Cath: 06/05/2014 Coronary dominance: right   Left Main: Normal  Left Anterior Descending (LAD): Normal in size and heavily calcified. It is 20% proximal stenosis. There is 60% stenosis in the midsegment at the origin of the first septal perforator. The rest of the vessel has 20% mid stenosis.  1st diagonal (D1):  Normal in size with 30% proximal stenosis.  2nd diagonal (D2): Very small in size.  3rd diagonal (D3): Very small in size.  Circumflex (LCx): Normal in size and nondominant. There is 20% diffuse stenosis in the midsegment.  1st obtuse marginal: Medium in size with minor irregularities.  2nd obtuse marginal: Normal in size with diffuse 20% proximal disease.  3rd obtuse marginal: Normal in size with minor irregularities.  Right Coronary Artery: Heavily calcified with 30% proximal stenosis. There is 99%  stenosis in the midsegment with diffuse 80% disease throughout the whole midsegment following that. The vessel has TIMI 2 flow.   Posterior descending artery: Normal   Posterior AV segment: Normal Posterolateral branchs: Normal  Left ventriculography: Was not performed due to chronic kidney disease. EF was normal by echo.  PCI Data: Vessel - RCA /Segment - mid  Percent Stenosis (pre) 99%  TIMI-flow 2  Stent : 3.0 x 38 mm overlapped with a 3.5 x 20 mm Promus drug-eluting stents  Percent Stenosis (post) 0%  TIMI-flow (post) 3 Final Conclusions:  1. Severe one-vessel coronary artery disease with heavily calcified mid RCA stenosis. There is also moderate mid LAD stenosis. The coronary arteries are overall heavily calcified. 2. Normal LV systolic function by echo. Mildly elevated left ventricular end-diastolic pressure. 3. Successful angioplasty and drug-eluting stent placement 2 to the right coronary artery. Recommendations:  Dual antiplatelet therapy for at least one year. Aggressive treatment of risk factors and smoking cessation are highly recommended.   Echo: 06/05/2014 Conclusions Study Conclusions - Left ventricle: The cavity size was normal. Wall thickness was increased in a pattern of mild LVH. Systolic function was normal. The estimated ejection fraction was in the range of 60% to 65%. Akinesis of the basalinferolateral myocardium. -  Pericardium, extracardiac: A trivial pericardial effusion was identified.  Assessment/Plan: 1. Post cath with PCI to RCA with 2 drug eluting stents - no further chest pain. Doing well. Continue with current regimen. Some nausea - we have changed some dosing times of his medicines. Ok to resume driving and gradually resume his activities.   2. Residual CAD -  To manage medically.   3. HTN - recheck by me is 140/70. He has lower readings at home. I have asked him to monitor.  4. PAD  5. HLD - on statin - will need fasting labs on return.  6. Tobacco abuse - total cessation encouraged.  Current medicines are reviewed with the patient today.  The patient does not have concerns regarding medicines other than what has been noted above.  The following changes have been made:  See above.  Labs/ tests ordered today include:    Orders Placed This Encounter  Procedures  . Basic metabolic panel  . CBC  . Hepatic function panel     Disposition:   FU with Dr. Eldridge DaceVaranasi in 6 weeks with fasting labs.   Patient is agreeable to this plan and will call if any problems develop in the interim.   Signed: Rosalio MacadamiaLori C. Laurelai Lepp, RN, ANP-C 06/18/2014 2:10 PM  [facility] [address] [place], [place]  [address] Phone: [phone] Fax: [phone]

## 2014-08-14 ENCOUNTER — Ambulatory Visit (INDEPENDENT_AMBULATORY_CARE_PROVIDER_SITE_OTHER): Payer: Medicare Other | Admitting: Interventional Cardiology

## 2014-08-14 ENCOUNTER — Encounter: Payer: Self-pay | Admitting: Interventional Cardiology

## 2014-08-14 VITALS — BP 164/70 | HR 65 | Ht 72.0 in | Wt 195.1 lb

## 2014-08-14 DIAGNOSIS — I6523 Occlusion and stenosis of bilateral carotid arteries: Secondary | ICD-10-CM

## 2014-08-14 DIAGNOSIS — F17200 Nicotine dependence, unspecified, uncomplicated: Secondary | ICD-10-CM

## 2014-08-14 DIAGNOSIS — I2510 Atherosclerotic heart disease of native coronary artery without angina pectoris: Secondary | ICD-10-CM | POA: Diagnosis not present

## 2014-08-14 DIAGNOSIS — R220 Localized swelling, mass and lump, head: Secondary | ICD-10-CM

## 2014-08-14 DIAGNOSIS — I10 Essential (primary) hypertension: Secondary | ICD-10-CM

## 2014-08-14 DIAGNOSIS — Z720 Tobacco use: Secondary | ICD-10-CM | POA: Diagnosis not present

## 2014-08-14 DIAGNOSIS — K1379 Other lesions of oral mucosa: Secondary | ICD-10-CM

## 2014-08-14 LAB — COMPREHENSIVE METABOLIC PANEL
ALT: 17 U/L (ref 0–53)
AST: 21 U/L (ref 0–37)
Albumin: 4.1 g/dL (ref 3.5–5.2)
Alkaline Phosphatase: 105 U/L (ref 39–117)
BUN: 14 mg/dL (ref 6–23)
CALCIUM: 9.2 mg/dL (ref 8.4–10.5)
CHLORIDE: 102 meq/L (ref 96–112)
CO2: 28 meq/L (ref 19–32)
CREATININE: 1.38 mg/dL (ref 0.40–1.50)
GFR: 54.16 mL/min — AB (ref >60.00)
GLUCOSE: 113 mg/dL — AB (ref 70–99)
Potassium: 3.9 mEq/L (ref 3.5–5.1)
Sodium: 138 mEq/L (ref 135–145)
TOTAL PROTEIN: 7.3 g/dL (ref 6.0–8.3)
Total Bilirubin: 1.2 mg/dL (ref 0.2–1.2)

## 2014-08-14 LAB — LIPID PANEL
CHOLESTEROL: 163 mg/dL (ref 0–200)
HDL: 45.5 mg/dL (ref >39.00)
LDL Cholesterol: 81 mg/dL (ref 0–99)
NonHDL: 117.5
Total CHOL/HDL Ratio: 4
Triglycerides: 185 mg/dL — ABNORMAL HIGH (ref 0.0–149.0)
VLDL: 37 mg/dL (ref 0.0–40.0)

## 2014-08-14 MED ORDER — AMLODIPINE BESYLATE 10 MG PO TABS: 10.0000 mg | ORAL_TABLET | Freq: Every day | ORAL | Status: DC

## 2014-08-14 NOTE — Progress Notes (Signed)
Patient ID: Alex Mullins, male   DOB: 1945/11/01, 69 y.o.   MRN: [id number]     Cardiology Office Note   Date:  08/14/2014   ID:  Alex Mullins, DOB 28-Nov-1945, MRN [id number]  PCP:  Nilda Simmer, MD    No chief complaint on file.  F/u CAD  Wt Readings from Last 3 Encounters:  08/14/14 195 lb 1.9 oz (88.506 kg)  06/18/14 199 lb 12.8 oz (90.629 kg)  06/06/14 200 lb 9.9 oz (91 kg)       History of Present Illness: Alex Mullins is a 69 y.o. male   Who is admitted to the hospital with unstable angina back in April. Drug-eluting stent placement to the RCA. He has done well since that time. He denies any chest discomfort. He has not used nitroglycerin.   He has been unable to stop smoking. He had to stop Chantix due to nausea. He has not tolerated patches due to rash.  His blood pressures have been variable at home. He does have readings that go up to 180s. The lowest readings that he has had are in the 120s.   No bleeding issues reported.   He also reports having small mass in the roof of his mouth that has been growing.    Past Medical History  Diagnosis Date  . COPD (chronic obstructive pulmonary disease)   . Hyperlipidemia   . Hypertension   . Vitamin D deficiency   . Carotid artery occlusion   . Unstable angina 06/04/2014    DES 2 to the RCA  . RBBB (right bundle branch block)     Past Surgical History  Procedure Laterality Date  . Hernia repair    . Inguinal hernia repair  2008    right  . Spine surgery  1982  . Left heart catheterization with coronary angiogram N/A 06/05/2014    Procedure: LEFT HEART CATHETERIZATION WITH CORONARY ANGIOGRAM;  Surgeon: Wellington Hampshire, MD; LAD 20/60/20%, D1 30%, CFX 20%, OM to 20%, RCA 99/80%>>0% w/  3.0 x 38 mm overlapped with a 3.5 x 20 mm Promus DES  . Coronary stent placement  06/05/2014    3.0 x 38 mm overlapped with a 3.5 x 20 mm Promus DES to the RCA     Current Outpatient Prescriptions  Medication Sig Dispense  Refill  . aspirin 81 MG tablet Take 1 tablet (81 mg total) by mouth daily.    Marland Kitchen atorvastatin (LIPITOR) 80 MG tablet Take 1 tablet (80 mg total) by mouth at bedtime. 30 tablet 11  . Cholecalciferol (VITAMIN D PO) Take 1 tablet by mouth daily.    . metoprolol tartrate (LOPRESSOR) 25 MG tablet Take 0.5 tablets (12.5 mg total) by mouth 2 (two) times daily. 60 tablet 11  . nitroGLYCERIN (NITROLINGUAL) 0.4 MG/SPRAY spray Place 1 spray under the tongue every 5 (five) minutes x 3 doses as needed for chest pain. 12 g 12  . pantoprazole (PROTONIX) 40 MG tablet Take 1 tablet (40 mg total) by mouth daily at 6 (six) AM. 30 tablet 11  . ticagrelor (BRILINTA) 90 MG TABS tablet Take 1 tablet (90 mg total) by mouth 2 (two) times daily. 60 tablet 11  . amLODipine (NORVASC) 10 MG tablet Take 1 tablet (10 mg total) by mouth daily. 90 tablet 3   No current facility-administered medications for this visit.    Allergies:   Review of patient's allergies indicates no known allergies.    Social History:  The patient  reports that he  has been smoking Cigarettes.  He has a 50 pack-year smoking history. He has never used smokeless tobacco. He reports that he drinks about 3.0 oz of alcohol per week. He reports that he does not use illicit drugs.   Family History:  The patient's family history includes Cancer (age of onset: 59) in his sister; Heart attack in his father; Heart disease in his father; Hypertension in his mother.    ROS:  Please see the history of present illness.   Otherwise, review of systems are positive for  Mass on the roof his mouth.   All other systems are reviewed and negative.    PHYSICAL EXAM: VS:  BP 164/70 mmHg  Pulse 65  Ht 6' (1.829 m)  Wt 195 lb 1.9 oz (88.506 kg)  BMI 26.46 kg/m2  SpO2 97% , BMI Body mass index is 26.46 kg/(m^2). GEN: Well nourished, well developed, in no acute distress HEENT: normalOral:  Small, 1 by 1.5 cm mass attached to the roof of his mouth , no obvious  ulceration Neck: no JVD, carotid bruits, or masses Cardiac: RRR; no murmurs, rubs, or gallops,no edema  Respiratory:  clear to auscultation bilaterally, normal work of breathing GI: soft, nontender, nondistended, + BS MS: no deformity or atrophy Skin: warm and dry, no rash Neuro:  Strength and sensation are intact Psych: euthymic mood, full affect    Recent Labs: 06/04/2014: Magnesium 1.9 06/18/2014: ALT 15; BUN 14; Creatinine, Ser 1.56*; Hemoglobin 13.8; Platelets 263.0; Potassium 3.6; Sodium 135   Lipid Panel    Component Value Date/Time   CHOL 209* 06/05/2014 0610   TRIG 183* 06/05/2014 0610   HDL 33* 06/05/2014 0610   CHOLHDL 6.3 06/05/2014 0610   VLDL 37 06/05/2014 0610   LDLCALC 139* 06/05/2014 0610     Other studies Reviewed: Additional studies/ records that were reviewed today with results demonstrating:  Cath report reviewed. Residual LAD disease of moderate degree. Mild carotid disease noted bilaterally in November 2015 Doppler..   ASSESSMENT AND PLAN:  1.  coronary artery disease: no angina. Continue aggressive secondary prevention.  2.  hyperlipidemia: continue high-dose atorvastatin. Check labs today. He is fasting. 3.  oral mass: Will refer to ENT. Given his long smoking history, he has a risk for malignancy. 4.  Hypertension: Given elevated blood pressure readings, will increase amlodipine to 10 mg daily. 5.  Tobacco abuse: He needs to stop smoking.  1. The patient was counseled on the dangers of tobacco use, both inhaled and oral, which include, but are not limited to cardiovascular disease, increased cancer risk of multiple types of cancer, COPD, peripheral vascular disease, strokes. 2. He was also counseled on the benefits of smoking cessation. 3. The patient was firmly advised to quit.    4. We also reviewed strategies to maximize success, including:  Removing cigarettes and smoking materials from environment  Stress management  Substitution of other  forms of reinforcement Support of family/friends.  Selecting a quit date.  Patient provided contact information for 1-800-QUIT-NOW          Current medicines are reviewed at length with the patient today.  The patient concerns regarding his medicines were addressed.  The following changes have been made:   Increase amlodipine  Labs/ tests ordered today include:  lipids  Orders Placed This Encounter  Procedures  . Comp Met (CMET)  . Lipid Profile  . Ambulatory referral to ENT    Recommend 150 minutes/week of aerobic exercise Low fat, low carb, high fiber  diet recommended  Disposition:   FU in 6 months   Teresita Madura., MD  08/14/2014 10:18 AM    [facility] [address], [place], [place]  [address] Phone: [phone]; Fax: [phone]

## 2014-08-14 NOTE — Patient Instructions (Signed)
Medication Instructions:  1) INCREASE Amlodipine to  once daily  Labwork: CMET and Lipids today  Testing/Procedures: None  Follow-Up: Your physician wants you to follow-up in: 6 months with Dr. Eldridge Dace. You will receive a reminder letter in the mail two months in advance. If you don't receive a letter, please call our office to schedule the follow-up appointment.   Any Other Special Instructions Will Be Listed Below (If Applicable).  You have been referred to ENT (Dr. Suszanne Conners)  Smoking Cessation, Tips for Success If you are ready to quit smoking, congratulations! You have chosen to help yourself be healthier. Cigarettes bring nicotine, tar, carbon monoxide, and other irritants into your body. Your lungs, heart, and blood vessels will be able to work better without these poisons. There are many different ways to quit smoking. Nicotine gum, nicotine patches, a nicotine inhaler, or nicotine nasal spray can help with physical craving. Hypnosis, support groups, and medicines help break the habit of smoking. WHAT THINGS CAN I DO TO MAKE QUITTING EASIER?  Here are some tips to help you quit for good:  Pick a date when you will quit smoking completely. Tell all of your friends and family about your plan to quit on that date.  Do not try to slowly cut down on the number of cigarettes you are smoking. Pick a quit date and quit smoking completely starting on that day.  Throw away all cigarettes.   Clean and remove all ashtrays from your home, work, and car.  On a card, write down your reasons for quitting. Carry the card with you and read it when you get the urge to smoke.  Cleanse your body of nicotine. Drink enough water and fluids to keep your urine clear or pale yellow. Do this after quitting to flush the nicotine from your body.  Learn to predict your moods. Do not let a bad situation be your excuse to have a cigarette. Some situations in your life might tempt you into wanting a  cigarette.  Never have "just one" cigarette. It leads to wanting another and another. Remind yourself of your decision to quit.  Change habits associated with smoking. If you smoked while driving or when feeling stressed, try other activities to replace smoking. Stand up when drinking your coffee. Brush your teeth after eating. Sit in a different chair when you read the paper. Avoid alcohol while trying to quit, and try to drink fewer caffeinated beverages. Alcohol and caffeine may urge you to smoke.  Avoid foods and drinks that can trigger a desire to smoke, such as sugary or spicy foods and alcohol.  Ask people who smoke not to smoke around you.  Have something planned to do right after eating or having a cup of coffee. For example, plan to take a walk or exercise.  Try a relaxation exercise to calm you down and decrease your stress. Remember, you may be tense and nervous for the first 2 weeks after you quit, but this will pass.  Find new activities to keep your hands busy. Play with a pen, coin, or rubber band. Doodle or draw things on paper.  Brush your teeth right after eating. This will help cut down on the craving for the taste of tobacco after meals. You can also try mouthwash.   Use oral substitutes in place of cigarettes. Try using lemon drops, carrots, cinnamon sticks, or chewing gum. Keep them handy so they are available when you have the urge to smoke.  When you have  the urge to smoke, try deep breathing.  Designate your home as a nonsmoking area.  If you are a heavy smoker, ask your health care provider about a prescription for nicotine chewing gum. It can ease your withdrawal from nicotine.  Reward yourself. Set aside the cigarette money you save and buy yourself something nice.  Look for support from others. Join a support group or smoking cessation program. Ask someone at home or at work to help you with your plan to quit smoking.  Always ask yourself, "Do I need this  cigarette or is this just a reflex?" Tell yourself, "Today, I choose not to smoke," or "I do not want to smoke." You are reminding yourself of your decision to quit.  Do not replace cigarette smoking with electronic cigarettes (commonly called e-cigarettes). The safety of e-cigarettes is unknown, and some may contain harmful chemicals.  If you relapse, do not give up! Plan ahead and think about what you will do the next time you get the urge to smoke. HOW WILL I FEEL WHEN I QUIT SMOKING? You may have symptoms of withdrawal because your body is used to nicotine (the addictive substance in cigarettes). You may crave cigarettes, be irritable, feel very hungry, cough often, get headaches, or have difficulty concentrating. The withdrawal symptoms are only temporary. They are strongest when you first quit but will go away within 10-14 days. When withdrawal symptoms occur, stay in control. Think about your reasons for quitting. Remind yourself that these are signs that your body is healing and getting used to being without cigarettes. Remember that withdrawal symptoms are easier to treat than the major diseases that smoking can cause.  Even after the withdrawal is over, expect periodic urges to smoke. However, these cravings are generally short lived and will go away whether you smoke or not. Do not smoke! WHAT RESOURCES ARE AVAILABLE TO HELP ME QUIT SMOKING? Your health care provider can direct you to community resources or hospitals for support, which may include:  Group support.  Education.  Hypnosis.  Therapy. Document Released: 10/31/2003 Document Revised: 06/18/2013 Document Reviewed: 07/20/2012 [facility] Patient Information 2015 [place], [place]. This information is not intended to replace advice given to you by your health care provider. Make sure you discuss any questions you have with your health care provider.

## 2014-08-21 ENCOUNTER — Telehealth: Payer: Self-pay | Admitting: Interventional Cardiology

## 2014-08-21 NOTE — Telephone Encounter (Signed)
Informed pt of lab results. Pt verbalized understanding. 

## 2014-08-21 NOTE — Telephone Encounter (Signed)
Follow UP   Pt returned call for lab results

## 2014-09-16 ENCOUNTER — Other Ambulatory Visit: Payer: Self-pay

## 2014-09-17 ENCOUNTER — Other Ambulatory Visit: Payer: Self-pay

## 2014-09-17 MED ORDER — AMLODIPINE BESYLATE 10 MG PO TABS: 10.0000 mg | ORAL_TABLET | Freq: Every day | ORAL | Status: DC

## 2014-09-17 MED ORDER — METOPROLOL TARTRATE 25 MG PO TABS: 12.5000 mg | ORAL_TABLET | Freq: Two times a day (BID) | ORAL | Status: DC

## 2014-09-17 MED ORDER — ATORVASTATIN CALCIUM 80 MG PO TABS: 80.0000 mg | ORAL_TABLET | Freq: Every day | ORAL | Status: DC

## 2014-09-18 ENCOUNTER — Other Ambulatory Visit: Payer: Self-pay

## 2014-09-18 MED ORDER — PANTOPRAZOLE SODIUM 40 MG PO TBEC: 40.0000 mg | DELAYED_RELEASE_TABLET | Freq: Every day | ORAL | Status: DC

## 2014-12-25 ENCOUNTER — Ambulatory Visit: Payer: Medicare Other | Admitting: Family

## 2014-12-25 ENCOUNTER — Encounter (HOSPITAL_COMMUNITY): Payer: Medicare Other

## 2015-01-02 ENCOUNTER — Encounter: Payer: Self-pay | Admitting: Cardiology

## 2015-01-02 ENCOUNTER — Ambulatory Visit (INDEPENDENT_AMBULATORY_CARE_PROVIDER_SITE_OTHER): Payer: Medicare Other | Admitting: Cardiology

## 2015-01-02 VITALS — BP 122/52 | HR 59 | Ht 72.0 in | Wt 192.8 lb

## 2015-01-02 DIAGNOSIS — I6529 Occlusion and stenosis of unspecified carotid artery: Secondary | ICD-10-CM

## 2015-01-02 NOTE — Patient Instructions (Signed)
Medication Instructions:  The current medical regimen is effective;  continue present plan and medications.  Labwork: None  Testing/Procedures: None  Follow-Up: Follow up in 6 months with Dr. Eldridge DaceVaranasi.  You will receive a letter in the mail 2 months before you are due.  Please call us when you receive this letter to schedule your follow up appointment.  If you need a refill on your cardiac medications before your next appointment, please call your pharmacy.  Thank you for choosing [facility]!!

## 2015-01-02 NOTE — Progress Notes (Signed)
01/02/2015 Alex Mullins   Aug 02, 1945  [id number]  Primary Physician Alex Mullins Primary Cardiologist: Dr. Eldridge Mullins   Reason for Visit/CC: 6 month F/u for CAD  HPI:  69 y/o white male followed by Dr. Eldridge Mullins with a h/o CAD s/p PCI +DES to the RCA in 05/2014, HTN, HLD and ongoing tobacco abuse. 2D echo 05/2014 showed normal LVEF of 55-60%.   He presents to clinic today for routien 6 month f/u. He is accompanied by his wife. He reports that he has done well since his last OV with Dr. Eldridge Mullins 07/2014. He denies any issues since then. No anginal symptoms, including no chest/ arm pain and no dyspnea. He also denies palpitations, dizziness, fatigue, syncope/ near syncope, orthopnea, PND and LEE. He reports full mediation compliance. No abnormal bleeding or melena with DAPT. He has not required any use of SL NTG. Unfortunately, he has continued to smoke, though he notes that he has cut back significantly. He has tried chantix in the past but discontinued due to nausea. He had no success with nicotine patches.  His EKG today shows sinus brady with a rate of 59 bpm and chronic RBBB.    Current Outpatient Prescriptions  Medication Sig Dispense Refill  . amLODipine (NORVASC) 10 MG tablet Take 1 tablet (10 mg total) by mouth daily. 90 tablet 3  . aspirin 81 MG tablet Take 1 tablet (81 mg total) by mouth daily.    Marland Kitchen. atorvastatin (LIPITOR) 80 MG tablet Take 1 tablet (80 mg total) by mouth at bedtime. 90 tablet 3  . Cholecalciferol (VITAMIN D PO) Take 1 tablet by mouth daily.    . metoprolol tartrate (LOPRESSOR) 25 MG tablet Take 0.5 tablets (12.5 mg total) by mouth 2 (two) times daily. 90 tablet 3  . nitroGLYCERIN (NITROLINGUAL) 0.4 MG/SPRAY spray Place 1 spray under the tongue every 5 (five) minutes x 3 doses as needed for chest pain. 12 g 12  . pantoprazole (PROTONIX) 40 MG tablet Take 1 tablet (40 mg total) by mouth daily at 6 (six) AM. 30 tablet 5  . ticagrelor (BRILINTA) 90 MG TABS tablet Take  1 tablet (90 mg total) by mouth 2 (two) times daily. 60 tablet 11   No current facility-administered medications for this visit.    No Known Allergies  Social History   Social History  . Marital Status: Married    Spouse Name: N/A  . Number of Children: N/A  . Years of Education: N/A   Occupational History  . Not on file.   Social History Main Topics  . Smoking status: Current Every Day Smoker -- 1.00 packs/day for 50 years    Types: Cigarettes  . Smokeless tobacco: Never Used     Comment: recently tried Nicoderm patches; BP increased, so stopped the patch  . Alcohol Use: 3.0 oz/week    5 Shots of liquor, 0 Standard drinks or equivalent per week     Comment: 3 drinks per day  . Drug Use: No  . Sexual Activity: Not on file   Other Topics Concern  . Not on file   Social History Narrative     Review of Systems: General: negative for chills, fever, night sweats or weight changes.  Cardiovascular: negative for chest pain, dyspnea on exertion, edema, orthopnea, palpitations, paroxysmal nocturnal dyspnea or shortness of breath Dermatological: negative for rash Respiratory: negative for cough or wheezing Urologic: negative for hematuria Abdominal: negative for nausea, vomiting, diarrhea, bright red blood per rectum, melena, or hematemesis Neurologic: negative  for visual changes, syncope, or dizziness All other systems reviewed and are otherwise negative except as noted above.    Blood pressure 122/52, pulse 59, height 6' (1.829 m), weight 192 lb 12.8 oz (87.454 kg).  General appearance: alert, cooperative and no distress Neck: no carotid bruit and no JVD Lungs: clear to auscultation bilaterally Heart: regular rate and rhythm, S1, S2 normal, no murmur, click, rub or gallop Extremities: no LEE Pulses: 2+ and symmetric Skin: warm and dry Neurologic: Grossly normal  EKG Sinus bradycardia. 59 bpm. Chronic RBBB.   ASSESSMENT AND PLAN:   1. CAD: s/p PCI + DES to RCA  05/2014. Stable w/o angina. Continue DAPT with ASA + Brilinta for at least 1 year, Lipitor and metoprolol. EF normal.   2. HTN: BP is well controlled at 122/52. Continue metoprolol and amlodipine.   3. HLD: on statin therapy with Lipitor. LDL improved from 139-->81 mg/dL.   4. Tobacco Abuse: smoking cessation strongly advised.   PLAN  Continue current plan of care. F/u with Dr. Eldridge Dace in 6 months.   Alex Lis PA-C 01/02/2015 9:23 AM

## 2015-02-21 ENCOUNTER — Encounter: Payer: Self-pay | Admitting: Family

## 2015-02-26 ENCOUNTER — Other Ambulatory Visit: Payer: Self-pay | Admitting: Vascular Surgery

## 2015-02-26 ENCOUNTER — Ambulatory Visit (INDEPENDENT_AMBULATORY_CARE_PROVIDER_SITE_OTHER)
Admission: RE | Admit: 2015-02-26 | Discharge: 2015-02-26 | Disposition: A | Discharge status: Home / Self Care | Payer: Medicare Other | Source: Ambulatory Visit | Attending: Vascular Surgery | Admitting: Vascular Surgery

## 2015-02-26 ENCOUNTER — Encounter: Payer: Self-pay | Admitting: Family

## 2015-02-26 ENCOUNTER — Ambulatory Visit (INDEPENDENT_AMBULATORY_CARE_PROVIDER_SITE_OTHER): Payer: Medicare Other | Admitting: Family

## 2015-02-26 ENCOUNTER — Ambulatory Visit (HOSPITAL_COMMUNITY)
Admission: RE | Admit: 2015-02-26 | Discharge: 2015-02-26 | Disposition: A | Discharge status: Home / Self Care | Payer: Medicare Other | Source: Ambulatory Visit | Attending: Vascular Surgery | Admitting: Vascular Surgery

## 2015-02-26 VITALS — BP 131/68 | HR 60 | Temp 96.9°F | Resp 16 | Ht 71.0 in | Wt 190.0 lb

## 2015-02-26 DIAGNOSIS — I739 Peripheral vascular disease, unspecified: Secondary | ICD-10-CM | POA: Diagnosis not present

## 2015-02-26 DIAGNOSIS — I6523 Occlusion and stenosis of bilateral carotid arteries: Secondary | ICD-10-CM

## 2015-02-26 DIAGNOSIS — Z720 Tobacco use: Secondary | ICD-10-CM

## 2015-02-26 DIAGNOSIS — F17200 Nicotine dependence, unspecified, uncomplicated: Secondary | ICD-10-CM

## 2015-02-26 DIAGNOSIS — I70213 Atherosclerosis of native arteries of extremities with intermittent claudication, bilateral legs: Secondary | ICD-10-CM

## 2015-02-26 NOTE — Patient Instructions (Addendum)
Stroke Prevention Some medical conditions and behaviors are associated with an increased chance of having a stroke. You may prevent a stroke by making healthy choices and managing medical conditions. HOW CAN I REDUCE MY RISK OF HAVING A STROKE?   Stay physically active. Get at least 30 minutes of activity on most or all days.  Do not smoke. It may also be helpful to avoid exposure to secondhand smoke.  Limit alcohol use. Moderate alcohol use is considered to be:  No more than 2 drinks per day for men.  No more than 1 drink per day for nonpregnant women.  Eat healthy foods. This involves:  Eating 5 or more servings of fruits and vegetables a day.  Making dietary changes that address high blood pressure (hypertension), high cholesterol, diabetes, or obesity.  Manage your cholesterol levels.  Making food choices that are high in fiber and low in saturated fat, trans fat, and cholesterol may control cholesterol levels.  Take any prescribed medicines to control cholesterol as directed by your health care provider.  Manage your diabetes.  Controlling your carbohydrate and sugar intake is recommended to manage diabetes.  Take any prescribed medicines to control diabetes as directed by your health care provider.  Control your hypertension.  Making food choices that are low in salt (sodium), saturated fat, trans fat, and cholesterol is recommended to manage hypertension.  Ask your health care provider if you need treatment to lower your blood pressure. Take any prescribed medicines to control hypertension as directed by your health care provider.  If you are 18-39 years of age, have your blood pressure checked every 3-5 years. If you are 40 years of age or older, have your blood pressure checked every year.  Maintain a healthy weight.  Reducing calorie intake and making food choices that are low in sodium, saturated fat, trans fat, and cholesterol are recommended to manage  weight.  Stop drug abuse.  Avoid taking birth control pills.  Talk to your health care provider about the risks of taking birth control pills if you are over 35 years old, smoke, get migraines, or have ever had a blood clot.  Get evaluated for sleep disorders (sleep apnea).  Talk to your health care provider about getting a sleep evaluation if you snore a lot or have excessive sleepiness.  Take medicines only as directed by your health care provider.  For some people, aspirin or blood thinners (anticoagulants) are helpful in reducing the risk of forming abnormal blood clots that can lead to stroke. If you have the irregular heart rhythm of atrial fibrillation, you should be on a blood thinner unless there is a good reason you cannot take them.  Understand all your medicine instructions.  Make sure that other conditions (such as anemia or atherosclerosis) are addressed. SEEK IMMEDIATE MEDICAL CARE IF:   You have sudden weakness or numbness of the face, arm, or leg, especially on one side of the body.  Your face or eyelid droops to one side.  You have sudden confusion.  You have trouble speaking (aphasia) or understanding.  You have sudden trouble seeing in one or both eyes.  You have sudden trouble walking.  You have dizziness.  You have a loss of balance or coordination.  You have a sudden, severe headache with no known cause.  You have new chest pain or an irregular heartbeat. Any of these symptoms may represent a serious problem that is an emergency. Do not wait to see if the symptoms will   go away. Get medical help at once. Call your local emergency services (911 in [place] Do not drive yourself to the hospital.   This information is not intended to replace advice given to you by your health care provider. Make sure you discuss any questions you have with your health care provider.   Document Released: 03/11/2004 Document Revised: 02/22/2014 Document Reviewed:  08/04/2012 Elsevier Interactive Patient Education 2016 [employer]     Intermittent Claudication Intermittent claudication is pain in your leg that occurs when you walk or exercise and goes away when you rest. The pain can occur in one or both legs. CAUSES Intermittent claudication is caused by the buildup of plaque within the major arteries in the body (atherosclerosis). The plaque, which makes arteries stiff and narrow, prevents enough blood from reaching your leg muscles. The pain occurs when you walk or exercise because your muscles need more blood when you are moving and exercising. RISK FACTORS Risk factors include:  A family history of atherosclerosis.  A personal history of stroke or heart disease.  Older age.  Being inactive or overweight.  Smoking cigarettes.  Having another health condition such as:  Diabetes.  High blood pressure.  High cholesterol. SIGNS AND SYMPTOMS  Your hip or leg may:   Ache.  Cramp.  Feel tight.  Feel weak.  Feel heavy. Over time, you may feel pain in your calf, thigh, or hip. DIAGNOSIS  Your health care provider may diagnose intermittent claudication based on your symptoms and medical history. Your health care provider may also do tests to learn more about your condition. These may include:  Blood tests.  An ultrasound.  Imaging tests such as angiography, magnetic resonance angiography (MRA), and computed tomography angiography (CTA). TREATMENT You may be treated for problems such as:  High blood pressure.  High cholesterol.  Diabetes. Other treatments may include:  Lifestyle changes such as:  Starting an exercise program.  Losing weight.  Quitting smoking.  Medicines to help restore blood flow through your legs.  Blood vessel surgery (angioplasty) to restore blood flow if your intermittent claudication is caused by severe peripheral artery disease. HOME CARE INSTRUCTIONS  Manage any other health  conditions you have.  Eat a diet low in saturated fats and calories to maintain a healthy weight.  Quit smoking, if you smoke.  Take medicines only as directed by your health care provider.  If your health care provider recommended an exercise program for you, follow it as directed. Your exercise program may involve:  Walking three or more times a week.  Walking until you have certain symptoms of intermittent claudication.  Resting until symptoms go away.  Gradually increasing walking time to about 50 minutes a day. SEEK MEDICAL CARE IF: Your condition is not getting better or is getting worse. SEEK IMMEDIATE MEDICAL CARE IF:   You have chest pain.  You have difficulty breathing.  You develop arm weakness.  You have trouble speaking.  Your face begins to droop. MAKE SURE YOU:  Understand these instructions.  Will watch your condition.  Will get help if you are not doing well or get worse.   This information is not intended to replace advice given to you by your health care provider. Make sure you discuss any questions you have with your health care provider.   Document Released: 12/05/2003 Document Revised: 02/22/2014 Document Reviewed: 05/10/2013 Elsevier Interactive Patient Education 2016 [employer].     Smoking Cessation, Tips for Success If you are ready to  quit smoking, congratulations! You have chosen to help yourself be healthier. Cigarettes bring nicotine, tar, carbon monoxide, and other irritants into your body. Your lungs, heart, and blood vessels will be able to work better without these poisons. There are many different ways to quit smoking. Nicotine gum, nicotine patches, a nicotine inhaler, or nicotine nasal spray can help with physical craving. Hypnosis, support groups, and medicines help break the habit of smoking. WHAT THINGS CAN I DO TO MAKE QUITTING EASIER?  Here are some tips to help you quit for good:  Pick a date when you will quit smoking  completely. Tell all of your friends and family about your plan to quit on that date.  Do not try to slowly cut down on the number of cigarettes you are smoking. Pick a quit date and quit smoking completely starting on that day.  Throw away all cigarettes.   Clean and remove all ashtrays from your home, work, and car.  On a card, write down your reasons for quitting. Carry the card with you and read it when you get the urge to smoke.  Cleanse your body of nicotine. Drink enough water and fluids to keep your urine clear or pale yellow. Do this after quitting to flush the nicotine from your body.  Learn to predict your moods. Do not let a bad situation be your excuse to have a cigarette. Some situations in your life might tempt you into wanting a cigarette.  Never have "just one" cigarette. It leads to wanting another and another. Remind yourself of your decision to quit.  Change habits associated with smoking. If you smoked while driving or when feeling stressed, try other activities to replace smoking. Stand up when drinking your coffee. Brush your teeth after eating. Sit in a different chair when you read the paper. Avoid alcohol while trying to quit, and try to drink fewer caffeinated beverages. Alcohol and caffeine may urge you to smoke.  Avoid foods and drinks that can trigger a desire to smoke, such as sugary or spicy foods and alcohol.  Ask people who smoke not to smoke around you.  Have something planned to do right after eating or having a cup of coffee. For example, plan to take a walk or exercise.  Try a relaxation exercise to calm you down and decrease your stress. Remember, you may be tense and nervous for the first 2 weeks after you quit, but this will pass.  Find new activities to keep your hands busy. Play with a pen, coin, or rubber band. Doodle or draw things on paper.  Brush your teeth right after eating. This will help cut down on the craving for the taste of tobacco  after meals. You can also try mouthwash.   Use oral substitutes in place of cigarettes. Try using lemon drops, carrots, cinnamon sticks, or chewing gum. Keep them handy so they are available when you have the urge to smoke.  When you have the urge to smoke, try deep breathing.  Designate your home as a nonsmoking area.  If you are a heavy smoker, ask your health care provider about a prescription for nicotine chewing gum. It can ease your withdrawal from nicotine.  Reward yourself. Set aside the cigarette money you save and buy yourself something nice.  Look for support from others. Join a support group or smoking cessation program. Ask someone at home or at work to help you with your plan to quit smoking.  Always ask yourself, "Do I  need this cigarette or is this just a reflex?" Tell yourself, "Today, I choose not to smoke," or "I do not want to smoke." You are reminding yourself of your decision to quit.  Do not replace cigarette smoking with electronic cigarettes (commonly called e-cigarettes). The safety of e-cigarettes is unknown, and some may contain harmful chemicals.  If you relapse, do not give up! Plan ahead and think about what you will do the next time you get the urge to smoke. HOW WILL I FEEL WHEN I QUIT SMOKING? You may have symptoms of withdrawal because your body is used to nicotine (the addictive substance in cigarettes). You may crave cigarettes, be irritable, feel very hungry, cough often, get headaches, or have difficulty concentrating. The withdrawal symptoms are only temporary. They are strongest when you first quit but will go away within 10-14 days. When withdrawal symptoms occur, stay in control. Think about your reasons for quitting. Remind yourself that these are signs that your body is healing and getting used to being without cigarettes. Remember that withdrawal symptoms are easier to treat than the major diseases that smoking can cause.  Even after the withdrawal  is over, expect periodic urges to smoke. However, these cravings are generally short lived and will go away whether you smoke or not. Do not smoke! WHAT RESOURCES ARE AVAILABLE TO HELP ME QUIT SMOKING? Your health care provider can direct you to community resources or hospitals for support, which may include:  Group support.  Education.  Hypnosis.  Therapy.   This information is not intended to replace advice given to you by your health care provider. Make sure you discuss any questions you have with your health care provider.   Document Released: 10/31/2003 Document Revised: 02/22/2014 Document Reviewed: 07/20/2012 Elsevier Interactive Patient Education 2016 [employer].     Steps to Quit Smoking  Smoking tobacco can be harmful to your health and can affect almost every organ in your body. Smoking puts you, and those around you, at risk for developing many serious chronic diseases. Quitting smoking is difficult, but it is one of the best things that you can do for your health. It is never too late to quit. WHAT ARE THE BENEFITS OF QUITTING SMOKING? When you quit smoking, you lower your risk of developing serious diseases and conditions, such as:  Lung cancer or lung disease, such as COPD.  Heart disease.  Stroke.  Heart attack.  Infertility.  Osteoporosis and bone fractures. Additionally, symptoms such as coughing, wheezing, and shortness of breath may get better when you quit. You may also find that you get sick less often because your body is stronger at fighting off colds and infections. If you are pregnant, quitting smoking can help to reduce your chances of having a baby of low birth weight. HOW DO I GET READY TO QUIT? When you decide to quit smoking, create a plan to make sure that you are successful. Before you quit:  Pick a date to quit. Set a date within the next two weeks to give you time to prepare.  Write down the reasons why you are quitting. Keep this list  in places where you will see it often, such as on your bathroom mirror or in your car or wallet.  Identify the people, places, things, and activities that make you want to smoke (triggers) and avoid them. Make sure to take these actions:  Throw away all cigarettes at home, at work, and in your car.  Throw away smoking  accessories, such as Set designer.  Clean your car and make sure to empty the ashtray.  Clean your home, including curtains and carpets.  Tell your family, friends, and coworkers that you are quitting. Support from your loved ones can make quitting easier.  Talk with your health care provider about your options for quitting smoking.  Find out what treatment options are covered by your health insurance. WHAT STRATEGIES CAN I USE TO QUIT SMOKING?  Talk with your healthcare provider about different strategies to quit smoking. Some strategies include:  Quitting smoking altogether instead of gradually lessening how much you smoke over a period of time. Research shows that quitting "cold [place]" is more successful than gradually quitting.  Attending in-person counseling to help you build problem-solving skills. You are more likely to have success in quitting if you attend several counseling sessions. Even short sessions of 10 minutes can be effective.  Finding resources and support systems that can help you to quit smoking and remain smoke-free after you quit. These resources are most helpful when you use them often. They can include:  Online chats with a Veterinary surgeon.  Telephone quitlines.  Printed Materials engineer.  Support groups or group counseling.  Text messaging programs.  Mobile phone applications.  Taking medicines to help you quit smoking. (If you are pregnant or breastfeeding, talk with your health care provider first.) Some medicines contain nicotine and some do not. Both types of medicines help with cravings, but the medicines that include nicotine  help to relieve withdrawal symptoms. Your health care provider may recommend:  Nicotine patches, gum, or lozenges.  Nicotine inhalers or sprays.  Non-nicotine medicine that is taken by mouth. Talk with your health care provider about combining strategies, such as taking medicines while you are also receiving in-person counseling. Using these two strategies together makes you more likely to succeed in quitting than if you used either strategy on its own. If you are pregnant or breastfeeding, talk with your health care provider about finding counseling or other support strategies to quit smoking. Do not take medicine to help you quit smoking unless told to do so by your health care provider. WHAT THINGS CAN I DO TO MAKE IT EASIER TO QUIT? Quitting smoking might feel overwhelming at first, but there is a lot that you can do to make it easier. Take these important actions:  Reach out to your family and friends and ask that they support and encourage you during this time. Call telephone quitlines, reach out to support groups, or work with a counselor for support.  Ask people who smoke to avoid smoking around you.  Avoid places that trigger you to smoke, such as bars, parties, or smoke-break areas at work.  Spend time around people who do not smoke.  Lessen stress in your life, because stress can be a smoking trigger for some people. To lessen stress, try:  Exercising regularly.  Deep-breathing exercises.  Yoga.  Meditating.  Performing a body scan. This involves closing your eyes, scanning your body from head to toe, and noticing which parts of your body are particularly tense. Purposefully relax the muscles in those areas.  Download or purchase mobile phone or tablet apps (applications) that can help you stick to your quit plan by providing reminders, tips, and encouragement. There are many free apps, such as QuitGuide from the [employer] developer for Disease Control and Prevention). You can  find other support for quitting smoking (smoking cessation) through smokefree.gov and other websites. HOW WILL  I FEEL WHEN I QUIT SMOKING? Within the first 24 hours of quitting smoking, you may start to feel some withdrawal symptoms. These symptoms are usually most noticeable 2-3 days after quitting, but they usually do not last beyond 2-3 weeks. Changes or symptoms that you might experience include:  Mood swings.  Restlessness, anxiety, or irritation.  Difficulty concentrating.  Dizziness.  Strong cravings for sugary foods in addition to nicotine.  Mild weight gain.  Constipation.  Nausea.  Coughing or a sore throat.  Changes in how your medicines work in your body.  A depressed mood.  Difficulty sleeping (insomnia). After the first 2-3 weeks of quitting, you may start to notice more positive results, such as:  Improved sense of smell and taste.  Decreased coughing and sore throat.  Slower heart rate.  Lower blood pressure.  Clearer skin.  The ability to breathe more easily.  Fewer sick days. Quitting smoking is very challenging for most people. Do not get discouraged if you are not successful the first time. Some people need to make many attempts to quit before they achieve long-term success. Do your best to stick to your quit plan, and talk with your health care provider if you have any questions or concerns.   This information is not intended to replace advice given to you by your health care provider. Make sure you discuss any questions you have with your health care provider.   Document Released: 01/26/2001 Document Revised: 06/18/2014 Document Reviewed: 06/18/2014 Elsevier Interactive Patient Education [employer]

## 2015-02-26 NOTE — Progress Notes (Signed)
Chief Complaint: Extracranial Carotid Artery Stenosis   History of Present Illness  Alex Mullins is a 70 y.o. male patient of Dr. Edilia Boickson who has known carotid stenosis and returns today for follow up.  Patient has not had previous carotid artery intervention.  Previous carotid studies demonstrated: RICA <40% stenosis, LICA <40% stenosis.  Patient has no history of TIA or stroke symptoms. The patient has not had amaurosis fugax or monocular blindness. The patient has not had unilateral facial drooping or hemiplegia. The patient has not had receptive or expressive aphasia.  The patient denies any history of MI. 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  The patient denies New Medical or Surgical History, but noted that he had 2 stents placed in his right RCA in April 2016; this is probably when the Brilinta and ASA were started.  Pt Diabetic: No Pt smoker: smoker (1 ppd, decreased from 2 ppd, started in his teens); he smoked on Chantix  Pt meds include: Statin: yes ASA: yes Other anticoagulants/antiplatelets: Brilinta    Past Medical History  Diagnosis Date  . COPD (chronic obstructive pulmonary disease) (HCC)   . Hyperlipidemia   . Hypertension   . Vitamin D deficiency   . Carotid artery occlusion   . Unstable angina (HCC) 06/04/2014    DES 2 to the RCA  . RBBB (right bundle branch block)     Social History Social History  Substance Use Topics  . Smoking status: Current Every Day Smoker -- 1.00 packs/day for 50 years    Types: Cigarettes  . Smokeless tobacco: Never Used     Comment: recently tried Nicoderm patches; BP increased, so stopped the patch  . Alcohol Use: 3.0 oz/week    5 Shots of liquor, 0 Standard drinks or equivalent per week     Comment: 3  drinks per day    Family History Family History  Problem Relation Age of Onset  . Heart disease Father   . Heart attack Father   . Hypertension Mother   . Cancer Sister 543    colon    Surgical History Past Surgical History  Procedure Laterality Date  . Hernia repair    . Inguinal hernia repair  2008    right  . Spine surgery  1982  . Left heart catheterization with coronary angiogram N/A 06/05/2014    Procedure: LEFT HEART CATHETERIZATION WITH CORONARY ANGIOGRAM;  Surgeon: Iran OuchMuhammad A Arida, MD; LAD 20/60/20%, D1 30%, CFX 20%, OM to 20%, RCA 99/80%>>0% w/  3.0 x 38 mm overlapped with a 3.5 x 20 mm Promus DES  . Coronary stent placement  06/05/2014    3.0 x 38 mm overlapped with a 3.5 x 20 mm Promus DES to the RCA    No Known Allergies  Current Outpatient Prescriptions  Medication Sig Dispense Refill  . amLODipine (NORVASC) 10 MG tablet Take 1 tablet (10 mg total) by mouth daily. 90 tablet 3  . aspirin 81 MG tablet Take 1 tablet (81 mg total) by mouth daily.    Marland Kitchen. atorvastatin (LIPITOR) 80 MG tablet Take 1 tablet (80 mg total) by mouth at bedtime. 90 tablet 3  . Cholecalciferol (VITAMIN D PO) Take 1 tablet by mouth daily.    . metoprolol tartrate (LOPRESSOR) 25 MG tablet Take  0.5 tablets (12.5 mg total) by mouth 2 (two) times daily. 90 tablet 3  . nitroGLYCERIN (NITROLINGUAL) 0.4 MG/SPRAY spray Place 1 spray under the tongue every 5 (five) minutes x 3 doses as needed for chest pain. 12 g 12  . pantoprazole (PROTONIX) 40 MG tablet Take 1 tablet (40 mg total) by mouth daily at 6 (six) AM. 30 tablet 5  . ticagrelor (BRILINTA) 90 MG TABS tablet Take 1 tablet (90 mg total) by mouth 2 (two) times daily. 60 tablet 11   No current facility-administered medications for this visit.    Review of Systems : See HPI for pertinent positives and negatives.  Physical Examination  Filed Vitals:   02/26/15 0906 02/26/15 0910  BP: 130/68 131/68  Pulse: 60 60  Temp:  96.9 F (36.1 C)   TempSrc:  Oral  Resp:  16  Height:  5\' 11"  (1.803 m)  Weight:  190 lb (86.183 kg)  SpO2:  99%   Body mass index is 26.51 kg/(m^2).  General: WDWN male in NAD GAIT: normal Eyes: PERRLA Pulmonary: Non-labored, no rales, or rhonchi, + wheezing, + chronic dry cough.  Cardiac: regular rhythm, + murmur.  VASCULAR EXAM Carotid Bruits Right Left   Negative Negative   Aorta is not palpable. Radial pulses are 2+ palpable and equal.      LE Pulses Right Left   FEMORAL 2+ palpable 2+ palpable    POPLITEAL not palpable  not palpable   POSTERIOR TIBIAL not palpable  not palpable    DORSALIS PEDIS  ANTERIOR TIBIAL not palpable  not palpable     Gastrointestinal: soft, nontender, BS WNL, no r/g, no palpated masses.  Musculoskeletal: No muscle atrophy/wasting. M/S 5/5 throughout, Extremities without ischemic changes.  Neurologic: A&O X 3; Appropriate Affect, Speech is normal CN 2-12 intact except for hard of hearing, Pain and light touch intact in extremities, Motor exam as listed above.         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 Bilateral LE pulses are not palpable distal to the femorals, no evidence of distal ischemia.   Non-Invasive Vascular Imaging CAROTID DUPLEX 02/26/2015   CEREBROVASCULAR DUPLEX EVALUATION    INDICATION: Carotid artery stenosis    PREVIOUS INTERVENTION(S): NA    DUPLEX EXAM:     RIGHT  LEFT  Peak Systolic Velocities (cm/s) End Diastolic Velocities (cm/s) Plaque LOCATION Peak Systolic Velocities (cm/s) End Diastolic Velocities (cm/s) Plaque  105 14  CCA PROXIMAL 123 15   104 16  CCA MID 135 17   99 17 CP CCA DISTAL 101 17 CP  188 9 CP ECA 136 12 CP  84 13 CP ICA PROXIMAL 124 20 CP  92 23  ICA MID 105 22   87 24   ICA DISTAL 77 16     0.81 ICA / CCA Ratio (PSV) 0.92  Antegrade Vertebral Flow Antegrade  158 Brachial Systolic Pressure (mmHg) 162  Triphasic  Brachial Artery Waveforms Triphasic    Plaque Morphology:  HM = Homogeneous, HT = Heterogeneous, CP = Calcific Plaque, SP = Smooth Plaque, IP = Irregular Plaque     ADDITIONAL FINDINGS: Right subclavian artery PSV125cm/sec; Left subclavian artery PSV158 cm/sec    IMPRESSION: Bilateral internal carotid artery stenosis present in the less than 40% range, which may be underestimated due to calcific plaque present making Doppler interrogation difficult.    Compared to the previous exam:  Essentially unchanged since previous study on 12/19/2013.  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    Assessment: Alex Mullins is a 70 y.o. male who has no hx of stroke or TIA and has mild bilateral intermittent claudication in his legs with walking. He has no signs of ischemia in his feet/legs.  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  Carotid duplex today suggests minimal bilateral ICA stenosis; essentially unchanged since previous study on 12/19/2013.  Fortunately he does not have DM, but unfortunately he continues to smoke    Plan:  Graduated walking program discussed.   The patient was counseled re smoking cessation and given several free resources re smoking cessation.  Follow-up in 1 year with Carotid Duplex scan and ABI's.   I discussed in depth with the patient the nature of atherosclerosis, and emphasized the importance of maximal medical management including strict control of blood pressure, blood glucose, and lipid levels, obtaining regular exercise, and  cessation of smoking.  The patient is aware that without maximal medical management the underlying atherosclerotic disease process will progress, limiting the benefit of any interventions. The patient was given information about stroke prevention and intermittent claudication, and what symptoms should prompt the patient to seek immediate medical care. Thank you for allowing [place] to participate in this patient's care.  Charisse March, RN, MSN, FNP-C [facility] Office: [phone]  Clinic Physician: Edilia Bo  02/26/2015 9:33 AM

## 2015-03-04 ENCOUNTER — Other Ambulatory Visit: Payer: Self-pay | Admitting: Interventional Cardiology

## 2015-03-20 ENCOUNTER — Encounter: Payer: Self-pay | Admitting: Gastroenterology

## 2015-06-19 ENCOUNTER — Other Ambulatory Visit: Payer: Self-pay | Admitting: *Deleted

## 2015-06-19 MED ORDER — TICAGRELOR 90 MG PO TABS: 90.0000 mg | ORAL_TABLET | Freq: Two times a day (BID) | ORAL | Status: DC

## 2015-06-23 ENCOUNTER — Other Ambulatory Visit: Payer: Self-pay

## 2015-06-23 MED ORDER — TICAGRELOR 90 MG PO TABS: 90.0000 mg | ORAL_TABLET | Freq: Two times a day (BID) | ORAL | Status: DC

## 2015-06-23 NOTE — Telephone Encounter (Signed)
Rx Refill

## 2015-07-01 ENCOUNTER — Encounter: Payer: Self-pay | Admitting: Interventional Cardiology

## 2015-07-01 ENCOUNTER — Ambulatory Visit (INDEPENDENT_AMBULATORY_CARE_PROVIDER_SITE_OTHER): Payer: Medicare Other | Admitting: Interventional Cardiology

## 2015-07-01 VITALS — BP 146/64 | HR 62 | Ht 72.0 in | Wt 190.0 lb

## 2015-07-01 DIAGNOSIS — F17200 Nicotine dependence, unspecified, uncomplicated: Secondary | ICD-10-CM

## 2015-07-01 DIAGNOSIS — E785 Hyperlipidemia, unspecified: Secondary | ICD-10-CM

## 2015-07-01 DIAGNOSIS — I10 Essential (primary) hypertension: Secondary | ICD-10-CM | POA: Diagnosis not present

## 2015-07-01 DIAGNOSIS — I2510 Atherosclerotic heart disease of native coronary artery without angina pectoris: Secondary | ICD-10-CM | POA: Diagnosis not present

## 2015-07-01 DIAGNOSIS — Z720 Tobacco use: Secondary | ICD-10-CM | POA: Diagnosis not present

## 2015-07-01 MED ORDER — CLOPIDOGREL BISULFATE 75 MG PO TABS: 75.0000 mg | ORAL_TABLET | Freq: Every day | ORAL | Status: DC

## 2015-07-01 NOTE — Patient Instructions (Addendum)
Medication Instructions:  Stop taking Brilinta and start taking Plavix 75 mg daily-all other medications remain the same.  Labwork: 5/23 anytime between 7:30 and 5:00-please do not eat or drink after midnight the night before labs are drawn.  Testing/Procedures: None  Follow-Up: Your physician wants you to follow-up in: 1 year. You will receive a reminder letter in the mail two months in advance. If you don't receive a letter, please call our office to schedule the follow-up appointment.     If you need a refill on your cardiac medications before your next appointment, please call your pharmacy.

## 2015-07-01 NOTE — Progress Notes (Signed)
Patient ID: Alex Mullins Pedigo, male   DOB: 1945-08-26, 70 y.o.   MRN: [id number]     Cardiology Office Note   Date:  07/01/2015   ID:  Alex Forgetharles Adger, DOB 1945-08-26, MRN [id number]  PCP:  Almedia BallsKELLY,SAM, MD    No chief complaint on file.  F/u CAD  Wt Readings from Last 3 Encounters:  07/01/15 190 lb (86.183 kg)  02/26/15 190 lb (86.183 kg)  01/02/15 192 lb 12.8 oz (87.454 kg)       History of Present Illness: Alex Mullins Lippe is a 70 y.o. male  Who is admitted to the hospital with unstable angina back in April. Drug-eluting stent placement to the RCA. He has done well since that time. He denies any chest discomfort. He has not used nitroglycerin.   He has been unable to stop smoking. He had to stop Chantix due to nausea. He has not tolerated patches due to rash.  His blood pressures have been variable at home. Typically 130-140 mm Hg.   No bleeding problems.     Past Medical History  Diagnosis Date  . COPD (chronic obstructive pulmonary disease) (HCC)   . Hyperlipidemia   . Hypertension   . Vitamin D deficiency   . Carotid artery occlusion   . Unstable angina (HCC) 06/04/2014    DES 2 to the RCA  . RBBB (right bundle branch block)     Past Surgical History  Procedure Laterality Date  . Hernia repair    . Inguinal hernia repair  2008    right  . Spine surgery  1982  . Left heart catheterization with coronary angiogram N/A 06/05/2014    Procedure: LEFT HEART CATHETERIZATION WITH CORONARY ANGIOGRAM;  Surgeon: Iran OuchMuhammad A Arida, MD; LAD 20/60/20%, D1 30%, CFX 20%, OM to 20%, RCA 99/80%>>0% w/  3.0 x 38 mm overlapped with a 3.5 x 20 mm Promus DES  . Coronary stent placement  06/05/2014    3.0 x 38 mm overlapped with a 3.5 x 20 mm Promus DES to the RCA     Current Outpatient Prescriptions  Medication Sig Dispense Refill  . amLODipine (NORVASC) 10 MG tablet Take 1 tablet (10 mg total) by mouth daily. 90 tablet 3  . aspirin 81 MG tablet Take 1 tablet (81 mg total) by mouth  daily.    Marland Kitchen. atorvastatin (LIPITOR) 80 MG tablet Take 1 tablet (80 mg total) by mouth at bedtime. 90 tablet 3  . Cholecalciferol (VITAMIN D PO) Take 1 tablet by mouth daily.    . metoprolol tartrate (LOPRESSOR) 25 MG tablet Take 0.5 tablets (12.5 mg total) by mouth 2 (two) times daily. 90 tablet 3  . nitroGLYCERIN (NITROLINGUAL) 0.4 MG/SPRAY spray Place 1 spray under the tongue every 5 (five) minutes x 3 doses as needed for chest pain. 12 g 12  . pantoprazole (PROTONIX) 40 MG tablet Take 1 tablet by mouth  daily at 6 AM 90 tablet 0  . ticagrelor (BRILINTA) 90 MG TABS tablet Take 1 tablet (90 mg total) by mouth 2 (two) times daily. 60 tablet 0   No current facility-administered medications for this visit.    Allergies:   Review of patient's allergies indicates no known allergies.    Social History:  The patient  reports that he has been smoking Cigarettes.  He has a 50 pack-year smoking history. He has never used smokeless tobacco. He reports that he drinks about 3.0 oz of alcohol per week. He reports that he does not use illicit drugs.  Family History:  The patient's family history includes Cancer (age of onset: 60) in his sister; Heart attack in his father; Heart disease in his father; Hypertension in his mother.    ROS:  Please see the history of present illness.   Otherwise, review of systems are positive for inability to stop smoking.   All other systems are reviewed and negative.    PHYSICAL EXAM: VS:  BP 146/64 mmHg  Pulse 62  Ht 6' (1.829 m)  Wt 190 lb (86.183 kg)  BMI 25.76 kg/m2 , BMI Body mass index is 25.76 kg/(m^2). GEN: Well nourished, well developed, in no acute distress HEENT: normal Neck: no JVD, carotid bruits, or masses Cardiac: RRR; no murmurs, rubs, or gallops,no edema  Respiratory:  clear to auscultation bilaterally, normal work of breathing GI: soft, nontender, nondistended, + BS MS: no deformity or atrophy Skin: warm and dry, no rash Neuro:  Strength and  sensation are intact Psych: euthymic mood, full affect     Recent Labs: 08/14/2014: ALT 17; BUN 14; Creatinine, Ser 1.38; Potassium 3.9; Sodium 138   Lipid Panel    Component Value Date/Time   CHOL 163 08/14/2014 1036   TRIG 185.0* 08/14/2014 1036   HDL 45.50 08/14/2014 1036   CHOLHDL 4 08/14/2014 1036   VLDL 37.0 08/14/2014 1036   LDLCALC 81 08/14/2014 1036     Other studies Reviewed: Additional studies/ records that were reviewed today with results demonstrating: cath report reviewed.   ASSESSMENT AND PLAN:  1. CAD: Stop Brilinta. Start clopidogrel 75 mg daily. No angina. Continue aggressive secondary prevention. He does have heavily calcified coronaries with other mild to moderate disease.  2. Hyperlipidemia: Lipids controlled in 2016. Will repeat when he is fasting. 3. Tobacco abuse: He has cut back from 2 packs a day down to 1 pack a day. I encouraged him to stop completely. 4. Hypertension: Blood pressure fairly well controlled. Continue current medicines.  It may be helpful to have readings outside of the doctor's office as well to make a more informed decisions regarding medications.   Current medicines are reviewed at length with the patient today.  The patient concerns regarding his medicines were addressed.  The following changes have been made:  No change  Labs/ tests ordered today include:  No orders of the defined types were placed in this encounter.    Recommend 150 minutes/week of aerobic exercise Low fat, low carb, high fiber diet recommended  Disposition:   FU in 1 year   Signed, Lance Muss, MD  07/01/2015 12:11 PM    [facility] [address] [place], [place], [place]  [address] Phone: [phone]; Fax: [phone]

## 2015-07-08 ENCOUNTER — Other Ambulatory Visit (INDEPENDENT_AMBULATORY_CARE_PROVIDER_SITE_OTHER): Payer: Medicare Other | Admitting: *Deleted

## 2015-07-08 DIAGNOSIS — E785 Hyperlipidemia, unspecified: Secondary | ICD-10-CM | POA: Diagnosis not present

## 2015-07-08 LAB — COMPREHENSIVE METABOLIC PANEL
ALBUMIN: 4.3 g/dL (ref 3.6–5.1)
ALK PHOS: 90 U/L (ref 40–115)
ALT: 18 U/L (ref 9–46)
AST: 17 U/L (ref 10–35)
BUN: 15 mg/dL (ref 7–25)
CALCIUM: 9.4 mg/dL (ref 8.6–10.3)
CO2: 23 mmol/L (ref 20–31)
Chloride: 103 mmol/L (ref 98–110)
Creat: 1.37 mg/dL — ABNORMAL HIGH (ref 0.70–1.18)
Glucose, Bld: 112 mg/dL — ABNORMAL HIGH (ref 65–99)
POTASSIUM: 4.8 mmol/L (ref 3.5–5.3)
Sodium: 137 mmol/L (ref 135–146)
TOTAL PROTEIN: 6.9 g/dL (ref 6.1–8.1)
Total Bilirubin: 1 mg/dL (ref 0.2–1.2)

## 2015-07-08 LAB — LIPID PANEL
CHOLESTEROL: 181 mg/dL (ref 125–200)
HDL: 57 mg/dL (ref >=40)
LDL Cholesterol: 83 mg/dL (ref <130)
Total CHOL/HDL Ratio: 3.2 Ratio (ref <=5.0)
Triglycerides: 205 mg/dL — ABNORMAL HIGH (ref <150)
VLDL: 41 mg/dL — ABNORMAL HIGH (ref <30)

## 2015-07-09 ENCOUNTER — Other Ambulatory Visit: Payer: Self-pay | Admitting: Interventional Cardiology

## 2015-07-19 ENCOUNTER — Other Ambulatory Visit: Payer: Self-pay | Admitting: Interventional Cardiology

## 2015-08-26 ENCOUNTER — Other Ambulatory Visit: Payer: Self-pay | Admitting: Physician Assistant

## 2015-12-11 ENCOUNTER — Other Ambulatory Visit: Payer: Self-pay | Admitting: *Deleted

## 2015-12-11 MED ORDER — CLOPIDOGREL BISULFATE 75 MG PO TABS: 75.0000 mg | ORAL_TABLET | Freq: Every day | ORAL | 1 refills | Status: DC

## 2016-02-27 ENCOUNTER — Encounter: Payer: Self-pay | Admitting: Family

## 2016-03-03 ENCOUNTER — Ambulatory Visit (HOSPITAL_COMMUNITY): Payer: Medicare Other

## 2016-03-03 ENCOUNTER — Ambulatory Visit: Payer: Medicare Other | Admitting: Family

## 2016-04-13 ENCOUNTER — Encounter: Payer: Self-pay | Admitting: Family

## 2016-04-19 ENCOUNTER — Ambulatory Visit (HOSPITAL_COMMUNITY)
Admission: RE | Admit: 2016-04-19 | Discharge: 2016-04-19 | Disposition: A | Discharge status: Home / Self Care | Payer: Medicare Other | Source: Ambulatory Visit | Attending: Family | Admitting: Family

## 2016-04-19 ENCOUNTER — Ambulatory Visit (INDEPENDENT_AMBULATORY_CARE_PROVIDER_SITE_OTHER): Payer: Medicare Other | Admitting: Family

## 2016-04-19 ENCOUNTER — Ambulatory Visit (INDEPENDENT_AMBULATORY_CARE_PROVIDER_SITE_OTHER)
Admission: RE | Admit: 2016-04-19 | Discharge: 2016-04-19 | Disposition: A | Discharge status: Home / Self Care | Payer: Medicare Other | Source: Ambulatory Visit | Attending: Family | Admitting: Family

## 2016-04-19 ENCOUNTER — Encounter: Payer: Self-pay | Admitting: Family

## 2016-04-19 VITALS — BP 142/79 | HR 119 | Temp 97.7°F | Resp 16 | Ht 72.0 in | Wt 183.0 lb

## 2016-04-19 DIAGNOSIS — I70213 Atherosclerosis of native arteries of extremities with intermittent claudication, bilateral legs: Secondary | ICD-10-CM | POA: Insufficient documentation

## 2016-04-19 DIAGNOSIS — I6523 Occlusion and stenosis of bilateral carotid arteries: Secondary | ICD-10-CM

## 2016-04-19 DIAGNOSIS — F17200 Nicotine dependence, unspecified, uncomplicated: Secondary | ICD-10-CM

## 2016-04-19 DIAGNOSIS — I499 Cardiac arrhythmia, unspecified: Secondary | ICD-10-CM

## 2016-04-19 LAB — VAS US CAROTID
LEFT ECA DIAS: -12 cm/s
LICADDIAS: -24 cm/s
LICAPDIAS: -16 cm/s
Left CCA dist dias: 16 cm/s
Left CCA dist sys: 89 cm/s
Left CCA prox dias: 12 cm/s
Left CCA prox sys: 93 cm/s
Left ICA dist sys: -101 cm/s
Left ICA prox sys: -97 cm/s
RCCAPDIAS: 9 cm/s
RIGHT CCA MID DIAS: 12 cm/s
RIGHT ECA DIAS: 13 cm/s
Right CCA prox sys: 70 cm/s
Right cca dist sys: -90 cm/s

## 2016-04-19 NOTE — Progress Notes (Signed)
[facility] [place] HISTORY AND PHYSICAL   MRN : [id number]  History of Present Illness:   Alex Mullins is a 71 y.o. male patient of Dr. Edilia Bo who has known carotid stenosis and returns today for follow up.  Patient has not had previous carotid artery intervention.  Previous carotid studies demonstrated: RICA <40% stenosis, LICA <40% stenosis. He denies any history of TIA or stroke symptoms. Specifically he denies a history of amaurosis fugax or monocular blindness, unilateral facial drooping, hemiplegia, or receptive or expressive aphasia.   The patient denies any history of MI. 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  He had 2 stents placed in his right RCA in April 2016; this is probably when the Brilinta and ASA were started.  Pt Diabetic: No Pt smoker: smoker (1 ppd, decreased from 2 ppd, started in his teens); he smoked on Chantix  Pt meds include: Statin: yes ASA: yes Other anticoagulants/antiplatelets: Brilinta      Current Outpatient Prescriptions  Medication Sig Dispense Refill  . amLODipine (NORVASC) 10 MG tablet Take 1 tablet by mouth  daily 90 tablet 3  . aspirin 81 MG tablet Take 1 tablet (81 mg total) by mouth daily.    Marland Kitchen atorvastatin (LIPITOR) 80 MG tablet Take 1 tablet by mouth at  bedtime 90 tablet 3  . Cholecalciferol (VITAMIN D PO) Take 1 tablet by mouth daily.    . clopidogrel (PLAVIX) 75 MG tablet Take 1 tablet (75 mg total) by mouth daily. 90 tablet 1  . metoprolol tartrate (LOPRESSOR) 25 MG tablet Take one-half tablet by  mouth two times daily 90 tablet 3  . nitroGLYCERIN (NITROLINGUAL) 0.4 MG/SPRAY spray Place 1 spray under the tongue every 5 (five) minutes x 3 doses as needed for chest pain. 12 g 12  .  pantoprazole (PROTONIX) 40 MG tablet Take 1 tablet by mouth  daily at 6AM 90 tablet 3  . ticagrelor (BRILINTA) 90 MG TABS tablet Take 1 tablet (90 mg total) by mouth 2 (two) times daily. 60 tablet 0   No current facility-administered medications for this visit.     Past Medical History:  Diagnosis Date  . Carotid artery occlusion   . COPD (chronic obstructive pulmonary disease) (HCC)   . Hyperlipidemia   . Hypertension   . RBBB (right bundle branch block)   . Unstable angina (HCC) 06/04/2014   DES 2 to the RCA  . Vitamin D deficiency     Social History Social History  Substance Use Topics  . Smoking status: Current Every Day Smoker    Packs/day: 1.00    Years: 50.00    Types: Cigarettes  . Smokeless tobacco: Never Used     Comment: recently tried Nicoderm patches; BP increased, so stopped the patch  . Alcohol use 3.0 oz/week    5 Shots of liquor per week     Comment: 3 drinks per day    Family History Family History  Problem Relation Age of Onset  . Heart disease Father   . Heart attack Father   . Hypertension Mother   . Cancer Sister 5    colon    Surgical History Past Surgical History:  Procedure Laterality Date  . CORONARY STENT PLACEMENT  06/05/2014   3.0 x 38  mm overlapped with a 3.5 x 20 mm Promus DES to the RCA  . HERNIA REPAIR    . INGUINAL HERNIA REPAIR  2008   right  . LEFT HEART CATHETERIZATION WITH CORONARY ANGIOGRAM N/A 06/05/2014   Procedure: LEFT HEART CATHETERIZATION WITH CORONARY ANGIOGRAM;  Surgeon: Iran OuchMuhammad A Arida, MD; LAD 20/60/20%, D1 30%, CFX 20%, OM to 20%, RCA 99/80%>>0% w/  3.0 x 38 mm overlapped with a 3.5 x 20 mm Promus DES  . SPINE SURGERY  1982    No Known Allergies  Current Outpatient Prescriptions  Medication Sig Dispense Refill  . amLODipine (NORVASC) 10 MG tablet Take 1 tablet by mouth  daily 90 tablet 3  . aspirin 81 MG tablet Take 1 tablet (81 mg total) by mouth daily.    Marland Kitchen. atorvastatin (LIPITOR) 80 MG tablet Take 1  tablet by mouth at  bedtime 90 tablet 3  . Cholecalciferol (VITAMIN D PO) Take 1 tablet by mouth daily.    . clopidogrel (PLAVIX) 75 MG tablet Take 1 tablet (75 mg total) by mouth daily. 90 tablet 1  . metoprolol tartrate (LOPRESSOR) 25 MG tablet Take one-half tablet by  mouth two times daily 90 tablet 3  . nitroGLYCERIN (NITROLINGUAL) 0.4 MG/SPRAY spray Place 1 spray under the tongue every 5 (five) minutes x 3 doses as needed for chest pain. 12 g 12  . pantoprazole (PROTONIX) 40 MG tablet Take 1 tablet by mouth  daily at 6AM 90 tablet 3  . ticagrelor (BRILINTA) 90 MG TABS tablet Take 1 tablet (90 mg total) by mouth 2 (two) times daily. 60 tablet 0   No current facility-administered medications for this visit.      REVIEW OF SYSTEMS: See HPI for pertinent positives and negatives.  Physical Examination Vitals:   04/19/16 1354 04/19/16 1357 04/19/16 1358  BP: 135/87 (!) 147/77 (!) 142/79  Pulse: (!) 119 (!) 119 (!) 119  Resp: 16    Temp: 97.7 F (36.5 C)    SpO2: 99%    Weight: 183 lb (83 kg)    Height: 6' (1.829 m)     Body mass index is 24.82 kg/m.  General:  WDWN male in NAD GAIT: normal Eyes: PERRLA Pulmonary: Respirations are non-labored, no rales or rhonchi, + wheezing, + chronic dry cough.  Cardiac: Irregular rhythm, + murmur.  VASCULAR EXAM Carotid Bruits Right Left   Negative Negative   Aorta is not palpable. Radial pulses are 2+ palpable and equal.      LE Pulses Right Left   FEMORAL 2+ palpable 2+ palpable    POPLITEAL not palpable  not palpable   POSTERIOR TIBIAL not palpable  not palpable    DORSALIS PEDIS  ANTERIOR TIBIAL not palpable  not palpable     Gastrointestinal: soft, nontender, BS WNL, no r/g, no palpated masses.  Musculoskeletal: No muscle  atrophy/wasting. M/S 5/5 throughout, Extremities without ischemic changes.  Neurologic: A&O X 3; Appropriate Affect, Speech is normal CN 2-12 intact except for hard of hearing, Pain and light touch intact in extremities, Motor exam as listed above.     ASSESSMENT:  Alex Mullins is a 71 y.o. male who has no hx of stroke or TIA and has mild bilateral intermittent claudication in his calves with walking. He has no signs of ischemia in his feet/legs. His wife indicates that he does not walk other than ADL's. Pt states "it hurts when I walk"; see Plan.  Fortunately he does not have DM, but unfortunately he continues  to smoke.  His cardiac rhythm is irregular now, but he is asymptomatic: denies chest pain, dyspnea, or light headedness.  I advised him or his wife to call his cardiologist office, Dr. Eldridge Dace today or tomorrow re this, and to call 911 if develops any of the above sypmtpms.   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  Carotid duplex today suggests minimal bilateral ICA stenosis; essentially unchanged since previous studies on 12/19/2013 and 02-26-15. Bilateral vertebral artery flow is antegrade.  Bilateral subclavian artery waveforms are normal.      PLAN:   Graduated walking program discussed and how to achieve.  The patient was counseled re smoking cessation and given several free resources re smoking cessation.   Based on today's exam and non-invasive vascular lab results, the patient will follow up in 1 year with Carotid Duplex scan and ABI's.  I discussed in depth with the patient the nature of atherosclerosis, and emphasized the importance of maximal medical management including strict control of blood pressure, blood glucose, and lipid levels, obtaining regular exercise, and cessation of smoking.  The patient is aware that without  maximal medical management the underlying atherosclerotic disease process will progress, limiting the benefit of any interventions.  The patient was given information about stroke prevention and what symptoms should prompt the patient to seek immediate medical care.  The patient was given information about PAD including signs, symptoms, treatment, what symptoms should prompt the patient to seek immediate medical care, and risk reduction measures to take. Thank you for allowing [place] to participate in this patient's care.  Charisse March, RN, MSN, FNP-C [facility] Office: [phone]  Clinic MD: Myra Gianotti 04/19/2016 2:23 PM

## 2016-04-19 NOTE — Progress Notes (Signed)
Vitals:   04/19/16 1354 04/19/16 1357  BP: 135/87 (!) 147/77  Pulse: (!) 119 (!) 119  Resp: 16   Temp: 97.7 F (36.5 C)   SpO2: 99%   Weight: 183 lb (83 kg)   Height: 6' (1.829 m)

## 2016-04-19 NOTE — Patient Instructions (Signed)
Peripheral Vascular Disease Peripheral vascular disease (PVD) is a disease of the blood vessels that are not part of your heart and brain. A simple term for PVD is poor circulation. In most cases, PVD narrows the blood vessels that carry blood from your heart to the rest of your body. This can result in a decreased supply of blood to your arms, legs, and internal organs, like your stomach or kidneys. However, it most often affects a person's lower legs and feet. There are two types of PVD.  Organic PVD. This is the more common type. It is caused by damage to the structure of blood vessels.  Functional PVD. This is caused by conditions that make blood vessels contract and tighten (spasm). Without treatment, PVD tends to get worse over time. PVD can also lead to acute ischemic limb. This is when an arm or limb suddenly has trouble getting enough blood. This is a medical emergency. Follow these instructions at home:  Take medicines only as told by your doctor.  Do not use any tobacco products, including cigarettes, chewing tobacco, or electronic cigarettes. If you need help quitting, ask your doctor.  Lose weight if you are overweight, and maintain a healthy weight as told by your doctor.  Eat a diet that is low in fat and cholesterol. If you need help, ask your doctor.  Exercise regularly. Ask your doctor for some good activities for you.  Take good care of your feet.  Wear comfortable shoes that fit well.  Check your feet often for any cuts or sores. Contact a doctor if:  You have cramps in your legs while walking.  You have leg pain when you are at rest.  You have coldness in a leg or foot.  Your skin changes.  You are unable to get or have an erection (erectile dysfunction).  You have cuts or sores on your feet that are not healing. Get help right away if:  Your arm or leg turns cold and blue.  Your arms or legs become red, warm, swollen, painful, or numb.  You have  chest pain or trouble breathing.  You suddenly have weakness in your face, arm, or leg.  You become very confused or you cannot speak.  You suddenly have a very bad headache.  You suddenly cannot see. This information is not intended to replace advice given to you by your health care provider. Make sure you discuss any questions you have with your health care provider. Document Released: 04/28/2009 Document Revised: 07/10/2015 Document Reviewed: 07/12/2013 Elsevier Interactive Patient Education  2017 Elsevier Inc.      Steps to Quit Smoking Smoking tobacco can be bad for your health. It can also affect almost every organ in your body. Smoking puts you and people around you at risk for many serious long-lasting (chronic) diseases. Quitting smoking is hard, but it is one of the best things that you can do for your health. It is never too late to quit. What are the benefits of quitting smoking? When you quit smoking, you lower your risk for getting serious diseases and conditions. They can include:  Lung cancer or lung disease.  Heart disease.  Stroke.  Heart attack.  Not being able to have children (infertility).  Weak bones (osteoporosis) and broken bones (fractures). If you have coughing, wheezing, and shortness of breath, those symptoms may get better when you quit. You may also get sick less often. If you are pregnant, quitting smoking can help to lower your chances   of having a baby of low birth weight. What can I do to help me quit smoking? Talk with your doctor about what can help you quit smoking. Some things you can do (strategies) include:  Quitting smoking totally, instead of slowly cutting back how much you smoke over a period of time.  Going to in-person counseling. You are more likely to quit if you go to many counseling sessions.  Using resources and support systems, such as:  Online chats with a Veterinary surgeon.  Phone quitlines.  Printed Chief Executive Officer.  Support groups or group counseling.  Text messaging programs.  Mobile phone apps or applications.  Taking medicines. Some of these medicines may have nicotine in them. If you are pregnant or breastfeeding, do not take any medicines to quit smoking unless your doctor says it is okay. Talk with your doctor about counseling or other things that can help you. Talk with your doctor about using more than one strategy at the same time, such as taking medicines while you are also going to in-person counseling. This can help make quitting easier. What things can I do to make it easier to quit? Quitting smoking might feel very hard at first, but there is a lot that you can do to make it easier. Take these steps:  Talk to your family and friends. Ask them to support and encourage you.  Call phone quitlines, reach out to support groups, or work with a Veterinary surgeon.  Ask people who smoke to not smoke around you.  Avoid places that make you want (trigger) to smoke, such as:  Bars.  Parties.  Smoke-break areas at work.  Spend time with people who do not smoke.  Lower the stress in your life. Stress can make you want to smoke. Try these things to help your stress:  Getting regular exercise.  Deep-breathing exercises.  Yoga.  Meditating.  Doing a body scan. To do this, close your eyes, focus on one area of your body at a time from head to toe, and notice which parts of your body are tense. Try to relax the muscles in those areas.  Download or buy apps on your mobile phone or tablet that can help you stick to your quit plan. There are many free apps, such as QuitGuide from the [employer] developer for Disease Control and Prevention). You can find more support from smokefree.gov and other websites. This information is not intended to replace advice given to you by your health care provider. Make sure you discuss any questions you have with your health care provider. Document Released:  11/28/2008 Document Revised: 09/30/2015 Document Reviewed: 06/18/2014 Elsevier Interactive Patient Education  2017 [employer].     Stroke Prevention Some medical conditions and behaviors are associated with an increased chance of having a stroke. You may prevent a stroke by making healthy choices and managing medical conditions. How can I reduce my risk of having a stroke?  Stay physically active. Get at least 30 minutes of activity on most or all days.  Do not smoke. It may also be helpful to avoid exposure to secondhand smoke.  Limit alcohol use. Moderate alcohol use is considered to be:  No more than 2 drinks per day for men.  No more than 1 drink per day for nonpregnant women.  Eat healthy foods. This involves:  Eating 5 or more servings of fruits and vegetables a day.  Making dietary changes that address high blood pressure (hypertension), high cholesterol, diabetes, or obesity.  Manage  your cholesterol levels.  Making food choices that are high in fiber and low in saturated fat, trans fat, and cholesterol may control cholesterol levels.  Take any prescribed medicines to control cholesterol as directed by your health care provider.  Manage your diabetes.  Controlling your carbohydrate and sugar intake is recommended to manage diabetes.  Take any prescribed medicines to control diabetes as directed by your health care provider.  Control your hypertension.  Making food choices that are low in salt (sodium), saturated fat, trans fat, and cholesterol is recommended to manage hypertension.  Ask your health care provider if you need treatment to lower your blood pressure. Take any prescribed medicines to control hypertension as directed by your health care provider.  If you are 65-40 years of age, have your blood pressure checked every 3-5 years. If you are 69 years of age or older, have your blood pressure checked every year.  Maintain a healthy weight.  Reducing  calorie intake and making food choices that are low in sodium, saturated fat, trans fat, and cholesterol are recommended to manage weight.  Stop drug abuse.  Avoid taking birth control pills.  Talk to your health care provider about the risks of taking birth control pills if you are over 77 years old, smoke, get migraines, or have ever had a blood clot.  Get evaluated for sleep disorders (sleep apnea).  Talk to your health care provider about getting a sleep evaluation if you snore a lot or have excessive sleepiness.  Take medicines only as directed by your health care provider.  For some people, aspirin or blood thinners (anticoagulants) are helpful in reducing the risk of forming abnormal blood clots that can lead to stroke. If you have the irregular heart rhythm of atrial fibrillation, you should be on a blood thinner unless there is a good reason you cannot take them.  Understand all your medicine instructions.  Make sure that other conditions (such as anemia or atherosclerosis) are addressed. Get help right away if:  You have sudden weakness or numbness of the face, arm, or leg, especially on one side of the body.  Your face or eyelid droops to one side.  You have sudden confusion.  You have trouble speaking (aphasia) or understanding.  You have sudden trouble seeing in one or both eyes.  You have sudden trouble walking.  You have dizziness.  You have a loss of balance or coordination.  You have a sudden, severe headache with no known cause.  You have new chest pain or an irregular heartbeat. Any of these symptoms may represent a serious problem that is an emergency. Do not wait to see if the symptoms will go away. Get medical help at once. Call your local emergency services (911 in [place] Do not drive yourself to the hospital. This information is not intended to replace advice given to you by your health care provider. Make sure you discuss any questions you have with  your health care provider. Document Released: 03/11/2004 Document Revised: 07/10/2015 Document Reviewed: 08/04/2012 Elsevier Interactive Patient Education  2017 Elsevier Inc.    Preventing Cerebrovascular Disease Arteries are blood vessels that carry blood that contains oxygen from the heart to all parts of the body. Cerebrovascular disease affects arteries that supply the brain. Any condition that blocks or disrupts blood flow to the brain can cause cerebrovascular disease. Brain cells that lose blood supply start to die within minutes (stroke). Stroke is the main danger of cerebrovascular disease. Atherosclerosis and high  blood pressure are common causes of cerebrovascular disease. Atherosclerosis is narrowing and hardening of an artery that results when fat, cholesterol, calcium, or other substances (plaque) build up inside an artery. Plaque reduces blood flow through the artery. High blood pressure increases the risk of bleeding inside the brain. Making diet and lifestyle changes to prevent atherosclerosis and high blood pressure lowers your risk of cerebrovascular disease. What nutrition changes can be made?  Eat more fruits, vegetables, and whole grains.  Reduce how much saturated fat you eat. To do this, eat less red meat and fewer full-fat dairy products.  Eat healthy proteins instead of red meat. Healthy proteins include:  Fish. Eat fish that contains heart-healthy omega-3 fatty acids, twice a week. Examples include salmon, albacore tuna, mackerel, and herring.  Chicken.  Nuts.  Low-fat or nonfat yogurt.  Avoid processed meats, like bacon and lunchmeat.  Avoid foods that contain:  A lot of sugar, such as sweets and drinks with added sugar.  A lot of salt (sodium). Avoid adding extra salt to your food, as told by your health care provider.  Trans fats, such as margarine and baked goods. Trans fats may be listed as "partially hydrogenated oils" on food labels.  Check food  labels to see how much sodium, sugar, and trans fats are in foods.  Use vegetable oils that contain low amounts of saturated fat, such as olive oil or canola oil. What lifestyle changes can be made?  Drink alcohol in moderation. This means no more than 1 drink a day for nonpregnant women and 2 drinks a day for men. One drink equals 12 oz of beer, 5 oz of wine, or 1 oz of hard liquor.  If you are overweight, ask your health care provider to recommend a weight-loss plan for you. Losing 5-10 lb (2.2-4.5 kg) can reduce your risk of diabetes, atherosclerosis, and high blood pressure.  Exercise for 30?60 minutes on most days, or as much as told by your health care provider.  Do moderate-intensity exercise, such as brisk walking, bicycling, and water aerobics. Ask your health care provider which activities are safe for you.  Do not use any products that contain nicotine or tobacco, such as cigarettes and e-cigarettes. If you need help quitting, ask your health care provider. Why are these changes important? Making these changes lowers your risk of many diseases that can cause cerebrovascular disease and stroke. Stroke is a leading cause of death and disability. Making these changes also improves your overall health and quality of life. What can I do to lower my risk? The following factors make you more likely to develop cerebrovascular disease:  Being overweight.  Smoking.  Being physically inactive.  Eating a high-fat diet.  Having certain health conditions, such as:  Diabetes.  High blood pressure.  Heart disease.  Atherosclerosis.  High cholesterol.  Sickle cell disease. Talk with your health care provider about your risk for cerebrovascular disease. Work with your health care provider to control diseases that you have that may contribute to cerebrovascular disease. Your health care provider may prescribe medicines to help prevent major causes of cerebrovascular disease. Where  to find more information: Learn more about preventing cerebrovascular disease from:  National Heart, Lung, and Blood Institute: ClassGossip.pl  Centers for Disease Control and Prevention: http://www.gonzalez-chase.net/ Summary  Cerebrovascular disease can lead to a stroke.  Atherosclerosis and high blood pressure are major causes of cerebrovascular disease.  Making diet and lifestyle changes can reduce your risk of cerebrovascular disease.  Work with your health care provider to get your risk factors under control to reduce your risk of cerebrovascular disease. This information is not intended to replace advice given to you by your health care provider. Make sure you discuss any questions you have with your health care provider. Document Released: 02/16/2015 Document Revised: 08/22/2015 Document Reviewed: 02/16/2015 Elsevier Interactive Patient Education  2017 [employer]

## 2016-05-12 ENCOUNTER — Encounter: Payer: Self-pay | Admitting: Interventional Cardiology

## 2016-05-20 ENCOUNTER — Other Ambulatory Visit: Payer: Self-pay | Admitting: Interventional Cardiology

## 2016-05-24 ENCOUNTER — Encounter: Payer: Self-pay | Admitting: Interventional Cardiology

## 2016-05-24 ENCOUNTER — Ambulatory Visit (INDEPENDENT_AMBULATORY_CARE_PROVIDER_SITE_OTHER): Payer: Medicare Other | Admitting: Interventional Cardiology

## 2016-05-24 VITALS — BP 142/80 | HR 54 | Resp 16 | Ht 71.0 in | Wt 183.0 lb

## 2016-05-24 DIAGNOSIS — Z720 Tobacco use: Secondary | ICD-10-CM

## 2016-05-24 DIAGNOSIS — I25119 Atherosclerotic heart disease of native coronary artery with unspecified angina pectoris: Secondary | ICD-10-CM

## 2016-05-24 DIAGNOSIS — I10 Essential (primary) hypertension: Secondary | ICD-10-CM | POA: Diagnosis not present

## 2016-05-24 DIAGNOSIS — E7800 Pure hypercholesterolemia, unspecified: Secondary | ICD-10-CM

## 2016-05-24 NOTE — Progress Notes (Signed)
Patient ID: Alex Mullins, male   DOB: 10/22/1945, 71 y.o.   MRN: [id number]     Cardiology Office Note   Date:  05/24/2016   ID:  Alex Mullins, DOB [birth year], MRN [id number]  PCP:  Nilda Simmer, MD    No chief complaint on file.  F/u CAD  Wt Readings from Last 3 Encounters:  05/24/16 183 lb (83 kg)  04/19/16 183 lb (83 kg)  07/01/15 190 lb (86.2 kg)       History of Present Illness: Alex Mullins is a 71 y.o. male  Who is admitted to the hospital with unstable angina back in April 2016. Drug-eluting stent placement to the RCA by Dr. Fletcher Anon. He has done well since that time. He denies any chest discomfort. He has not used nitroglycerin.   He has been unable to stop smoking. He had to stop Chantix due to nausea. He has not tolerated patches due to rash.  He continues t smoke 1 ppd.  Unable to quit.  His blood pressures have been variable at home. Typically 176 mm Hg systolic.   No bleeding problems.   Walks with his wife but is limited by bilateral claudication.  He is followed by [facility].  Walking was recommended to improve circulation.     Past Medical History:  Diagnosis Date  . Carotid artery occlusion   . COPD (chronic obstructive pulmonary disease) ([place])   . Hyperlipidemia   . Hypertension   . RBBB (right bundle branch block)   . Unstable angina ([place]) 06/04/2014   DES 2 to the RCA  . Vitamin D deficiency     Past Surgical History:  Procedure Laterality Date  . CORONARY STENT PLACEMENT  06/05/2014   3.0 x 38 mm overlapped with a 3.5 x 20 mm Promus DES to the RCA  . HERNIA REPAIR    . INGUINAL HERNIA REPAIR  2008   right  . LEFT HEART CATHETERIZATION WITH CORONARY ANGIOGRAM N/A 06/05/2014   Procedure: LEFT HEART CATHETERIZATION WITH CORONARY ANGIOGRAM;  Surgeon: Wellington Hampshire, MD; LAD 20/60/20%, D1 30%, CFX 20%, OM to 20%, RCA 99/80%>>0% w/  3.0 x 38 mm overlapped with a 3.5 x 20 mm Promus DES  . SPINE SURGERY  1982     Current Outpatient Prescriptions    Medication Sig Dispense Refill  . amLODipine (NORVASC) 10 MG tablet Take 1 tablet by mouth  daily 90 tablet 3  . atorvastatin (LIPITOR) 80 MG tablet Take 1 tablet by mouth at  bedtime 90 tablet 3  . Cholecalciferol (VITAMIN D PO) Take 1 tablet by mouth daily.    . clopidogrel (PLAVIX) 75 MG tablet Take 1 tablet (75 mg total) by mouth daily. 90 tablet 1  . metoprolol tartrate (LOPRESSOR) 25 MG tablet Take one-half tablet by  mouth two times daily 90 tablet 3  . nitroGLYCERIN (NITROLINGUAL) 0.4 MG/SPRAY spray Place 1 spray under the tongue every 5 (five) minutes x 3 doses as needed for chest pain. 12 g 12  . pantoprazole (PROTONIX) 40 MG tablet Take 1 tablet by mouth  daily at 6AM 90 tablet 3   No current facility-administered medications for this visit.     Allergies:   Patient has no known allergies.    Social History:  The patient  reports that he has been smoking Cigarettes.  He has a 50.00 pack-year smoking history. He has never used smokeless tobacco. He reports that he drinks about 3.0 oz of alcohol per week . He reports that he  does not use drugs.   Family History:  The patient's family history includes Cancer (age of onset: 39) in his sister; Heart attack in his father; Heart disease in his father; Hypertension in his mother.    ROS:  Please see the history of present illness.   Otherwise, review of systems are positive for inability to stop smoking; claudication.   All other systems are reviewed and negative.    PHYSICAL EXAM: VS:  BP (!) 142/80   Pulse (!) 54   Resp 16   Ht 5' 11" (1.803 m)   Wt 183 lb (83 kg)   BMI 25.52 kg/m  , BMI Body mass index is 25.52 kg/m. GEN: Well nourished, well developed, in no acute distress  HEENT: normal  Neck: no JVD, carotid bruits, or masses Cardiac: RRR; no murmurs, rubs, or gallops,no edema ; diminished pedal pulses bilaterally Respiratory:  clear to auscultation bilaterally, normal work of breathing GI: soft, nontender,  nondistended, + BS MS: no deformity or atrophy  Skin: warm and dry, no rash Neuro:  Strength and sensation are intact Psych: euthymic mood, full affect     Recent Labs: 07/08/2015: ALT 18; BUN 15; Creat 1.37; Potassium 4.8; Sodium 137   Lipid Panel    Component Value Date/Time   CHOL 181 07/08/2015 0852   TRIG 205 (H) 07/08/2015 0852   HDL 57 07/08/2015 0852   CHOLHDL 3.2 07/08/2015 0852   VLDL 41 (H) 07/08/2015 0852   LDLCALC 83 07/08/2015 0852     Other studies Reviewed: Additional studies/ records that were reviewed today with results demonstrating: cath report reviewed.   ASSESSMENT AND PLAN:  1. CAD: Stopped Brilinta. Started clopidogrel 75 mg daily. Stop aspirin.  No angina on current medical therapy. Continue aggressive secondary prevention. He does have heavily calcified coronaries with other mild to moderate disease.  2. Hyperlipidemia: Lipids controlled in 2016. Will repeat when he is fasting.  LDL controlled in 5/17. Check labs when fasting.  3. Tobacco abuse: He has cut back from 2 packs a day down to 1 pack a day over a year ago.  Not much progress since then. I encouraged him to stop completely.  He has tried pills, patches and gum.  He wants to do it on his own. 4. Hypertension: Blood pressure fairly well controlled. Continue current medicines.Continue to check BP at home.  Typical reading around 130/60.   Current medicines are reviewed at length with the patient today.  The patient concerns regarding his medicines were addressed.  The following changes have been made:  No change  Labs/ tests ordered today include:   Orders Placed This Encounter  Procedures  . Comp Met (CMET)  . Lipid panel  . EKG 12-Lead    Recommend 150 minutes/week of aerobic exercise Low fat, low carb, high fiber diet recommended  Disposition:   FU in 1 year   Signed, Larae Grooms, MD  05/24/2016 2:33 PM    [facility] [address], [place],  [place]  [address] Phone: [phone]; Fax: [phone]

## 2016-05-24 NOTE — Patient Instructions (Signed)
Medication Instructions:  1) STOP ASPIRIN  Labwork: Your physician recommends that you return for FASTING lab work.  Testing/Procedures: None  Follow-Up: Your physician wants you to follow-up in: 1 year with Dr. Eldridge Dace. You will receive a reminder letter in the mail two months in advance. If you don't receive a letter, please call our office to schedule the follow-up appointment.   Any Other Special Instructions Will Be Listed Below (If Applicable).     If you need a refill on your cardiac medications before your next appointment, please call your pharmacy.

## 2016-05-25 ENCOUNTER — Other Ambulatory Visit: Payer: Medicare Other

## 2016-05-25 LAB — COMPREHENSIVE METABOLIC PANEL
A/G RATIO: 1.5 (ref 1.2–2.2)
ALK PHOS: 78 IU/L (ref 39–117)
ALT: 16 IU/L (ref 0–44)
AST: 16 IU/L (ref 0–40)
Albumin: 4 g/dL (ref 3.5–4.8)
BILIRUBIN TOTAL: 0.6 mg/dL (ref 0.0–1.2)
BUN/Creatinine Ratio: 11 (ref 10–24)
BUN: 15 mg/dL (ref 8–27)
CHLORIDE: 101 mmol/L (ref 96–106)
CO2: 22 mmol/L (ref 18–29)
Calcium: 9.4 mg/dL (ref 8.6–10.2)
Creatinine, Ser: 1.32 mg/dL — ABNORMAL HIGH (ref 0.76–1.27)
GFR calc Af Amer: 62 mL/min/{1.73_m2} (ref >59)
GFR calc non Af Amer: 54 mL/min/{1.73_m2} — ABNORMAL LOW (ref >59)
GLUCOSE: 110 mg/dL — AB (ref 65–99)
Globulin, Total: 2.7 g/dL (ref 1.5–4.5)
POTASSIUM: 4.3 mmol/L (ref 3.5–5.2)
Sodium: 142 mmol/L (ref 134–144)
Total Protein: 6.7 g/dL (ref 6.0–8.5)

## 2016-05-25 LAB — LIPID PANEL
CHOLESTEROL TOTAL: 136 mg/dL (ref 100–199)
Chol/HDL Ratio: 3.5 ratio (ref 0.0–5.0)
HDL: 39 mg/dL — AB (ref >39)
LDL Calculated: 61 mg/dL (ref 0–99)
Triglycerides: 181 mg/dL — ABNORMAL HIGH (ref 0–149)
VLDL CHOLESTEROL CAL: 36 mg/dL (ref 5–40)

## 2016-05-25 NOTE — Addendum Note (Signed)
Addended by: Tonita Phoenix on: 05/25/2016 09:11 AM   Modules accepted: Orders

## 2016-07-13 ENCOUNTER — Other Ambulatory Visit: Payer: Self-pay | Admitting: Interventional Cardiology

## 2016-07-22 ENCOUNTER — Other Ambulatory Visit: Payer: Self-pay | Admitting: Interventional Cardiology

## 2016-08-31 ENCOUNTER — Other Ambulatory Visit: Payer: Self-pay | Admitting: Interventional Cardiology

## 2016-09-24 IMAGING — CR DG CHEST 2V
2 series · 2 of 2 positions shown · non-contrast
Comparison: None.

CLINICAL DATA: Chest pain since 2 a.m.. Patient awaken from sleep
with chest pain.

EXAM:
CHEST  2 VIEW

[chest pa]
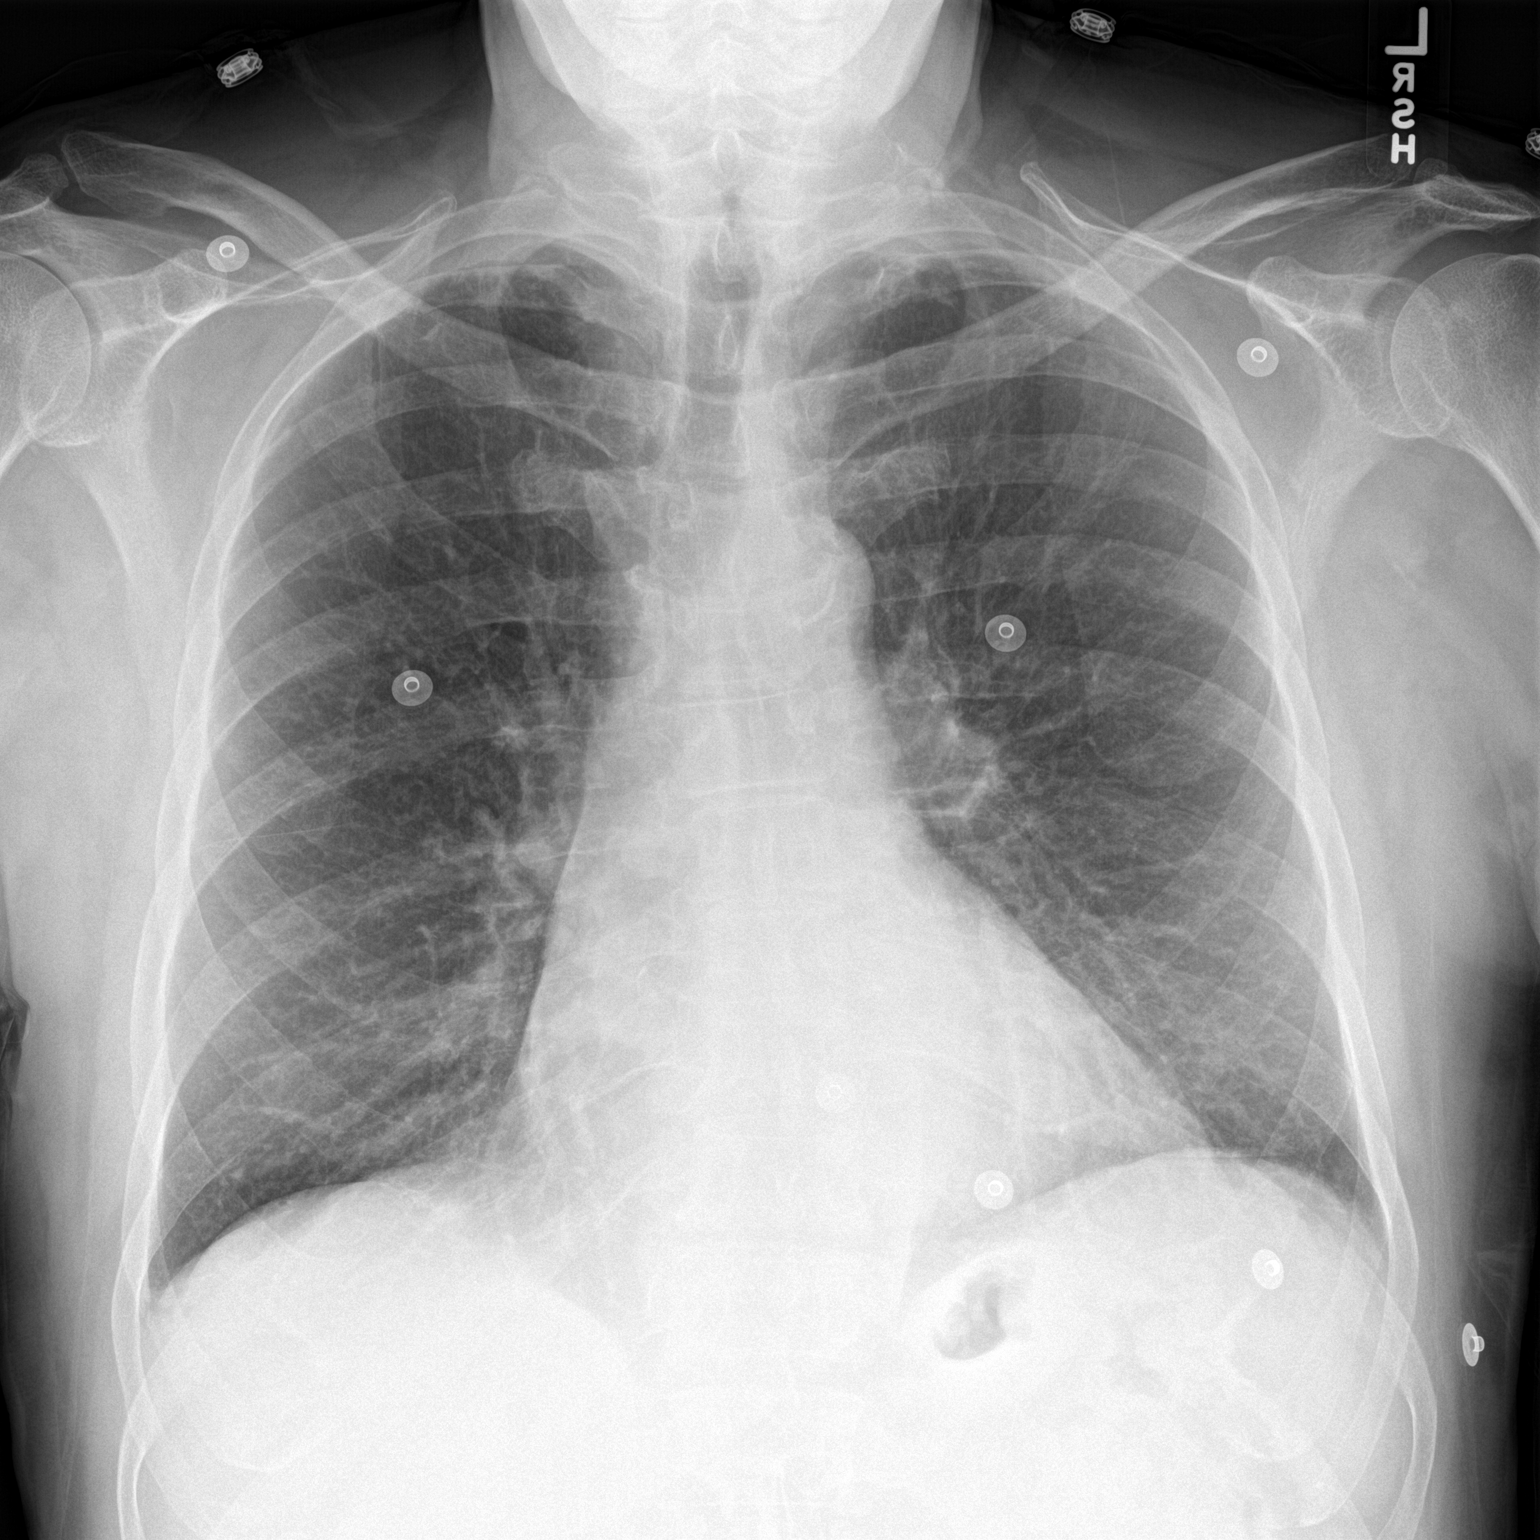

[chest lat]
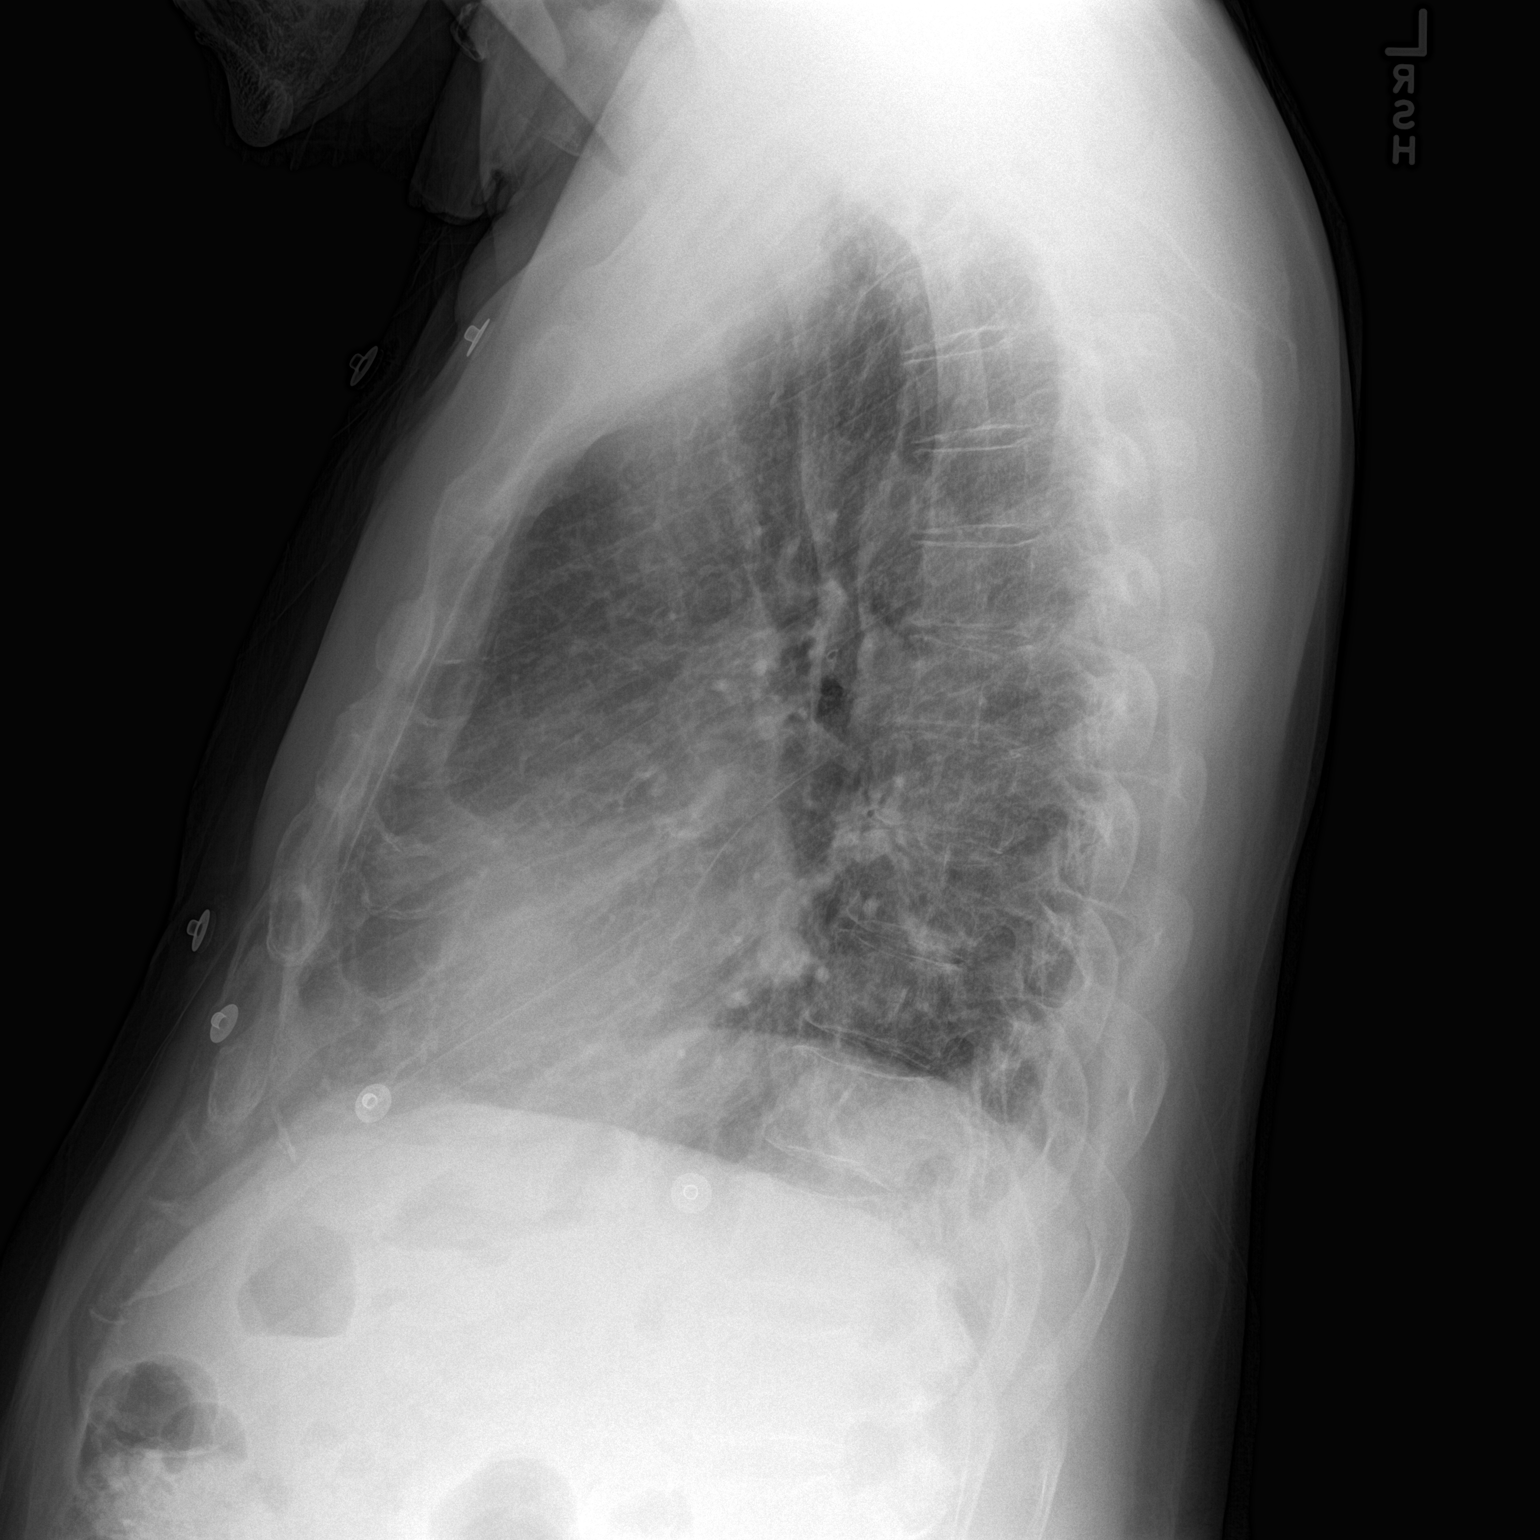

[2 of 2 positions shown; findings below may reference images not displayed]

FINDINGS: The cardiopericardial silhouette is mildly enlarged for projection.
Diffuse interstitial prominence is present, which may be associated
with emphysema. There may be some interstitial pulmonary edema
superimposed. Bilateral pleural apical scarring is present. There is
no focal airspace consolidation. Aortic arch atherosclerosis. No
pleural effusion.
IMPRESSION: Cardiomegaly and diffuse interstitial prominence. Interstitial
prominence may be associated with emphysema in this cigarette
smoker. Interstitial pulmonary edema may also produce this
appearance.

## 2017-04-07 ENCOUNTER — Other Ambulatory Visit: Payer: Self-pay | Admitting: Interventional Cardiology

## 2017-04-14 ENCOUNTER — Other Ambulatory Visit: Payer: Self-pay

## 2017-04-14 DIAGNOSIS — I739 Peripheral vascular disease, unspecified: Secondary | ICD-10-CM

## 2017-04-14 DIAGNOSIS — I6523 Occlusion and stenosis of bilateral carotid arteries: Secondary | ICD-10-CM

## 2017-04-25 ENCOUNTER — Encounter: Payer: Self-pay | Admitting: Family

## 2017-04-25 ENCOUNTER — Ambulatory Visit (HOSPITAL_COMMUNITY)
Admission: RE | Admit: 2017-04-25 | Discharge: 2017-04-25 | Disposition: A | Discharge status: Home / Self Care | Payer: Medicare Other | Source: Ambulatory Visit | Attending: Family | Admitting: Family

## 2017-04-25 ENCOUNTER — Other Ambulatory Visit: Payer: Self-pay

## 2017-04-25 ENCOUNTER — Ambulatory Visit: Payer: Medicare Other | Admitting: Family

## 2017-04-25 ENCOUNTER — Ambulatory Visit (INDEPENDENT_AMBULATORY_CARE_PROVIDER_SITE_OTHER)
Admission: RE | Admit: 2017-04-25 | Discharge: 2017-04-25 | Disposition: A | Discharge status: Home / Self Care | Payer: Medicare Other | Source: Ambulatory Visit | Attending: Family | Admitting: Family

## 2017-04-25 VITALS — BP 142/66 | HR 55 | Temp 96.9°F | Resp 16 | Ht 71.0 in | Wt 179.0 lb

## 2017-04-25 DIAGNOSIS — I779 Disorder of arteries and arterioles, unspecified: Secondary | ICD-10-CM

## 2017-04-25 DIAGNOSIS — I6521 Occlusion and stenosis of right carotid artery: Secondary | ICD-10-CM | POA: Insufficient documentation

## 2017-04-25 DIAGNOSIS — R9439 Abnormal result of other cardiovascular function study: Secondary | ICD-10-CM | POA: Diagnosis not present

## 2017-04-25 DIAGNOSIS — I6523 Occlusion and stenosis of bilateral carotid arteries: Secondary | ICD-10-CM

## 2017-04-25 DIAGNOSIS — F17200 Nicotine dependence, unspecified, uncomplicated: Secondary | ICD-10-CM

## 2017-04-25 DIAGNOSIS — I739 Peripheral vascular disease, unspecified: Secondary | ICD-10-CM | POA: Insufficient documentation

## 2017-04-25 NOTE — Patient Instructions (Addendum)
Steps to Quit Smoking Smoking tobacco can be bad for your health. It can also affect almost every organ in your body. Smoking puts you and people around you at risk for many serious long-lasting (chronic) diseases. Quitting smoking is hard, but it is one of the best things that you can do for your health. It is never too late to quit. What are the benefits of quitting smoking? When you quit smoking, you lower your risk for getting serious diseases and conditions. They can include:  Lung cancer or lung disease.  Heart disease.  Stroke.  Heart attack.  Not being able to have children (infertility).  Weak bones (osteoporosis) and broken bones (fractures).  If you have coughing, wheezing, and shortness of breath, those symptoms may get better when you quit. You may also get sick less often. If you are pregnant, quitting smoking can help to lower your chances of having a baby of low birth weight. What can I do to help me quit smoking? Talk with your doctor about what can help you quit smoking. Some things you can do (strategies) include:  Quitting smoking totally, instead of slowly cutting back how much you smoke over a period of time.  Going to in-person counseling. You are more likely to quit if you go to many counseling sessions.  Using resources and support systems, such as: ? Online chats with a counselor. ? Phone quitlines. ? Printed self-help materials. ? Support groups or group counseling. ? Text messaging programs. ? Mobile phone apps or applications.  Taking medicines. Some of these medicines may have nicotine in them. If you are pregnant or breastfeeding, do not take any medicines to quit smoking unless your doctor says it is okay. Talk with your doctor about counseling or other things that can help you.  Talk with your doctor about using more than one strategy at the same time, such as taking medicines while you are also going to in-person counseling. This can help make  quitting easier. What things can I do to make it easier to quit? Quitting smoking might feel very hard at first, but there is a lot that you can do to make it easier. Take these steps:  Talk to your family and friends. Ask them to support and encourage you.  Call phone quitlines, reach out to support groups, or work with a counselor.  Ask people who smoke to not smoke around you.  Avoid places that make you want (trigger) to smoke, such as: ? Bars. ? Parties. ? Smoke-break areas at work.  Spend time with people who do not smoke.  Lower the stress in your life. Stress can make you want to smoke. Try these things to help your stress: ? Getting regular exercise. ? Deep-breathing exercises. ? Yoga. ? Meditating. ? Doing a body scan. To do this, close your eyes, focus on one area of your body at a time from head to toe, and notice which parts of your body are tense. Try to relax the muscles in those areas.  Download or buy apps on your mobile phone or tablet that can help you stick to your quit plan. There are many free apps, such as QuitGuide from the CDC (Centers for Disease Control and Prevention). You can find more support from smokefree.gov and other websites.  This information is not intended to replace advice given to you by your health care provider. Make sure you discuss any questions you have with your health care provider. Document Released: 11/28/2008 Document   Revised: 09/30/2015 Document Reviewed: 06/18/2014 Elsevier Interactive Patient Education  2018 Elsevier Inc.     Peripheral Vascular Disease Peripheral vascular disease (PVD) is a disease of the blood vessels that are not part of your heart and brain. A simple term for PVD is poor circulation. In most cases, PVD narrows the blood vessels that carry blood from your heart to the rest of your body. This can result in a decreased supply of blood to your arms, legs, and internal organs, like your stomach or kidneys.  However, it most often affects a person's lower legs and feet. There are two types of PVD.  Organic PVD. This is the more common type. It is caused by damage to the structure of blood vessels.  Functional PVD. This is caused by conditions that make blood vessels contract and tighten (spasm).  Without treatment, PVD tends to get worse over time. PVD can also lead to acute ischemic limb. This is when an arm or limb suddenly has trouble getting enough blood. This is a medical emergency. Follow these instructions at home:  Take medicines only as told by your doctor.  Do not use any tobacco products, including cigarettes, chewing tobacco, or electronic cigarettes. If you need help quitting, ask your doctor.  Lose weight if you are overweight, and maintain a healthy weight as told by your doctor.  Eat a diet that is low in fat and cholesterol. If you need help, ask your doctor.  Exercise regularly. Ask your doctor for some good activities for you.  Take good care of your feet. ? Wear comfortable shoes that fit well. ? Check your feet often for any cuts or sores. Contact a doctor if:  You have cramps in your legs while walking.  You have leg pain when you are at rest.  You have coldness in a leg or foot.  Your skin changes.  You are unable to get or have an erection (erectile dysfunction).  You have cuts or sores on your feet that are not healing. Get help right away if:  Your arm or leg turns cold and blue.  Your arms or legs become red, warm, swollen, painful, or numb.  You have chest pain or trouble breathing.  You suddenly have weakness in your face, arm, or leg.  You become very confused or you cannot speak.  You suddenly have a very bad headache.  You suddenly cannot see. This information is not intended to replace advice given to you by your health care provider. Make sure you discuss any questions you have with your health care provider. Document Released:  04/28/2009 Document Revised: 07/10/2015 Document Reviewed: 07/12/2013 Elsevier Interactive Patient Education  2017 Elsevier Inc.  

## 2017-04-25 NOTE — Progress Notes (Signed)
Chief Complaint: Follow up Extracranial Carotid Artery Stenosis and peripheral artery occlusive disease    History of Present Illness  Alex Mullins is a 72 y.o. male whom Dr. Edilia Bo has been monitoring for carotid stenosis.  Patient has not had previous carotid artery intervention.  Previous carotid studies demonstrated: RICA <40% stenosis, LICA <40% stenosis. He denies any history of TIA or stroke symptoms. Specifically he denies a history of amaurosis fugax or monocular blindness, unilateral facial drooping, hemiplegia, or receptive or expressive aphasia.   The patient denies any history of MI. 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  He had 2 stents placed in his right RCA in April 2016, by Dr. Eldridge Dace.  Pt Diabetic: No Pt smoker: smoker (1 ppd, decreased from 2 ppd, started in his teens); he smoked on Chantix  Pt meds include: Statin: yes ASA: no Other anticoagulants/antiplatelets: Plavix   Past Medical History:  Diagnosis Date  . Carotid artery occlusion   . COPD (chronic obstructive pulmonary disease) (HCC)   . Hyperlipidemia   . Hypertension   . RBBB (right bundle branch block)   . Unstable angina (HCC) 06/04/2014   DES 2 to the RCA  . Vitamin D deficiency     Social History Social History   Tobacco Use  . Smoking status: Current Every Day Smoker    Packs/day: 1.00    Years: 50.00    Pack years: 50.00    Types: Cigarettes  . Smokeless tobacco: Never Used  . Tobacco comment: recently tried Nicoderm patches; BP increased, so stopped the patch  Substance Use Topics  . Alcohol use: Yes    Alcohol/week: 3.0 oz    Types: 5 Shots of liquor per week    Comment: 3 drinks per day  . Drug use: No    Family History Family History   Problem Relation Age of Onset  . Heart disease Father   . Heart attack Father   . Hypertension Mother   . Cancer Sister 91       colon    Surgical History Past Surgical History:  Procedure Laterality Date  . CORONARY STENT PLACEMENT  06/05/2014   3.0 x 38 mm overlapped with a 3.5 x 20 mm Promus DES to the RCA  . HERNIA REPAIR    . INGUINAL HERNIA REPAIR  2008   right  . LEFT HEART CATHETERIZATION WITH CORONARY ANGIOGRAM N/A 06/05/2014   Procedure: LEFT HEART CATHETERIZATION WITH CORONARY ANGIOGRAM;  Surgeon: Iran Ouch, MD; LAD 20/60/20%, D1 30%, CFX 20%, OM to 20%, RCA 99/80%>>0% w/  3.0 x 38 mm overlapped with a 3.5 x 20 mm Promus DES  . SPINE SURGERY  1982    No Known Allergies  Current Outpatient Medications  Medication Sig Dispense Refill  . amLODipine (NORVASC) 10 MG tablet TAKE 1 TABLET BY MOUTH  DAILY 90 tablet 3  . atorvastatin (LIPITOR) 80 MG tablet TAKE 1 TABLET BY MOUTH AT  BEDTIME 90 tablet 3  . Cholecalciferol (VITAMIN D PO) Take 1 tablet by mouth daily.    . clopidogrel (PLAVIX) 75 MG tablet TAKE 1 TABLET BY MOUTH  DAILY 90 tablet 3  . metoprolol tartrate (LOPRESSOR) 25 MG tablet TAKE ONE-HALF TABLET BY  MOUTH TWO TIMES DAILY 90 tablet 3  .  nitroGLYCERIN (NITROLINGUAL) 0.4 MG/SPRAY spray Place 1 spray under the tongue every 5 (five) minutes x 3 doses as needed for chest pain. 12 g 12  . pantoprazole (PROTONIX) 40 MG tablet TAKE 1 TABLET BY MOUTH  DAILY AT 6AM 90 tablet 0   No current facility-administered medications for this visit.     Review of Systems : See HPI for pertinent positives and negatives.  Physical Examination  Vitals:   04/25/17 1226 04/25/17 1230 04/25/17 1234  BP: (!) 144/65 138/69 (!) 142/66  Pulse: (!) 54 (!) 55 (!) 55  Resp: 16    Temp: (!) 96.9 F (36.1 C)    TempSrc: Oral    SpO2: 97%    Weight: 179 lb (81.2 kg)    Height: 5\' 11"  (1.803 m)     Body mass index is 24.97 kg/m.  General: WDWN male in  NAD GAIT:normal HENT: No gross abnormalities  Eyes: PERRLA Pulmonary: Respirations are non-labored, no rales or rhonchi, + wheezing, + chronic dry cough. Cardiac: Regular rhythm, + murmur.  VASCULAR EXAM Carotid Bruits Right Left   Negative Negative    Abdominal aortic pulse is not palpable. Radial pulses are 2+ palpable and equal.      LE Pulses Right Left   FEMORAL 2+ palpable 2+ palpable    POPLITEAL not palpable  not palpable   POSTERIOR TIBIAL not palpable  not palpable    DORSALIS PEDIS  ANTERIOR TIBIAL not palpable  not palpable     Gastrointestinal: soft, nontender, BS WNL, no r/g, no palpated masses. Musculoskeletal: No muscle atrophy/wasting. M/S 5/5 throughout, Extremities without ischemic changes.  Skin: No ulcers, no cellulitis.  Seborrheic dermatitis of face and scalp with flaking.  Neurologic:  A&O X 3; appropriate affect, sensation is normal; speech is normal, CN 2-12 intact, pain and light touch intact in extremities, motor exam as listed above. Psychiatric: Normal thought content, mood appropriate to clinical situation.     Assessment: Alex Mullins is a 72 y.o. male who has no hx of stroke or TIA and has mild left intermittent claudication in his calf with walking. He has no signs of ischemia in his feet/legs. His wife indicates that he does not walk other than ADL's. Pt states "it hurts when I walk".  He has minimal bilateral extracranial carotid artery stenosis, no hx of stroke or TIA.   Fortunately he does not have DM, but unfortunately he continues to smoke.   DATA    Carotid Duplex (04/25/17): 1-39% stenosis of bilateral ICA. Bilateral vertebral artery flow is antegrade.  Bilateral subclavian artery waveforms are normal.  No change compared to the exam on  04-19-16.   ABI (Date: 04/25/2017):  R:   ABI: 0.60 (was 0.53 on 04-19-16),   PT: waveform morphology not documented (was absent)  DP: waveform morphology not documented (was mono)  TBI:  0.43 (was 0.44)  L:   ABI: 0.70 (was 0.62),   PT: waveform morphology not documented (was mono)  DP: waveform morphology not documented (was mono)   TBI: 0.56 (was 0.51)  Slightly improved bilateral ABI with moderate disease.     PLAN:   Graduated walking program discussed and how to achieve.  The patient was counseled re smoking cessation and given several free resources re smoking cessation.    Based on today's exam and non-invasive vascular lab results, the patient will follow up in 1year with ABI's, 2 years with carotid duplex.  I discussed in depth with the patient the nature of  atherosclerosis, and emphasized the importance of maximal medical management including strict control of blood pressure, blood glucose, and lipid levels, obtaining regular exercise, and cessation of smoking.  The patient is aware that without maximal medical management the underlying atherosclerotic disease process will progress, limiting the benefit of any interventions. The patient was given information about stroke prevention and what symptoms should prompt the patient to seek immediate medical care. Thank you for allowing us to participate in this patient's care.  Charisse MarchSuzanne Nickel, RN, MSN, FNP-C [facility] Office: [phone]  Clinic Physician: Myra GianottiBrabham  04/25/17 12:44 PM

## 2017-04-25 NOTE — Progress Notes (Signed)
Vitals:   04/25/17 1226 04/25/17 1230  BP: (!) 144/65 138/69  Pulse: (!) 54 (!) 55  Resp: 16   Temp: (!) 96.9 F (36.1 C)   TempSrc: Oral   SpO2: 97%   Weight: 179 lb (81.2 kg)   Height: 5\' 11"  (1.803 m)

## 2017-07-19 ENCOUNTER — Other Ambulatory Visit: Payer: Self-pay | Admitting: Interventional Cardiology

## 2017-07-29 ENCOUNTER — Ambulatory Visit: Payer: Medicare Other | Admitting: Interventional Cardiology

## 2017-08-08 NOTE — Progress Notes (Signed)
Cardiology Office Note    Date:  08/09/2017   ID:  Alex ForgetCharles Mullins, DOB [birth year], MRN [id number]  PCP:  Wilburn MylarKelly, Samuel S, MD  Cardiologist: Lance MussJayadeep [name], MD  Chief Complaint  Patient presents with  . Follow-up    History of Present Illness:  Alex Mullins is a 72 y.o. male with history of CAD status post DES to the RCA in 2016, tobacco abuse, hypertension, claudication symptoms followed by [facility] recommend walking. Patient last saw Dr. Eldridge DaceVaranasi 05/2016 which time he stopped Brilinta and started Plavix 75 mg daily.  Smoking cessation recommended.  Patient comes in for yearly f/u. Walks 1/2 mile 5 days/week. Smoking 1 ppd. Was down to 10 cigarettes/day on Chantix but quit because he thought he could do the rest on his own.  Unfortunately he went back to smoking a whole pack per day.  Denies chest pain, palpitations, dyspnea, dizziness or presyncope.   Past Medical History:  Diagnosis Date  . Carotid artery occlusion   . COPD (chronic obstructive pulmonary disease) (HCC)   . Hyperlipidemia   . Hypertension   . RBBB (right bundle branch block)   . Unstable angina (HCC) 06/04/2014   DES 2 to the RCA  . Vitamin D deficiency     Past Surgical History:  Procedure Laterality Date  . CORONARY STENT PLACEMENT  06/05/2014   3.0 x 38 mm overlapped with a 3.5 x 20 mm Promus DES to the RCA  . HERNIA REPAIR    . INGUINAL HERNIA REPAIR  2008   right  . LEFT HEART CATHETERIZATION WITH CORONARY ANGIOGRAM N/A 06/05/2014   Procedure: LEFT HEART CATHETERIZATION WITH CORONARY ANGIOGRAM;  Surgeon: Iran OuchMuhammad A Arida, MD; LAD 20/60/20%, D1 30%, CFX 20%, OM to 20%, RCA 99/80%>>0% w/  3.0 x 38 mm overlapped with a 3.5 x 20 mm Promus DES  . SPINE SURGERY  1982    Current Medications: Current Meds  Medication Sig  . amLODipine (NORVASC) 10 MG tablet TAKE 1 TABLET BY MOUTH  DAILY  . atorvastatin (LIPITOR) 80 MG tablet TAKE 1 TABLET BY MOUTH AT  BEDTIME  . Cholecalciferol (VITAMIN D PO) Take 1  tablet by mouth daily.  . clopidogrel (PLAVIX) 75 MG tablet TAKE 1 TABLET BY MOUTH  DAILY  . metoprolol tartrate (LOPRESSOR) 25 MG tablet TAKE ONE-HALF TABLET BY  MOUTH TWO TIMES DAILY  . nitroGLYCERIN (NITROLINGUAL) 0.4 MG/SPRAY spray Place 1 spray under the tongue every 5 (five) minutes x 3 doses as needed for chest pain.  . pantoprazole (PROTONIX) 40 MG tablet Take 1 tablet (40 mg total) by mouth daily. Please keep upcoming appt for future refills. Thank you     Allergies:   Patient has no known allergies.   Social History   Socioeconomic History  . Marital status: Married    Spouse name: Not on file  . Number of children: Not on file  . Years of education: Not on file  . Highest education level: Not on file  Occupational History  . Not on file  Social Needs  . Financial resource strain: Not on file  . Food insecurity:    Worry: Not on file    Inability: Not on file  . Transportation needs:    Medical: Not on file    Non-medical: Not on file  Tobacco Use  . Smoking status: Current Every Day Smoker    Packs/day: 1.00    Years: 50.00    Pack years: 50.00    Types: Cigarettes  .  Smokeless tobacco: Never Used  . Tobacco comment: recently tried Nicoderm patches; BP increased, so stopped the patch  Substance and Sexual Activity  . Alcohol use: Yes    Alcohol/week: 3.0 oz    Types: 5 Shots of liquor per week    Comment: 3 drinks per day  . Drug use: No  . Sexual activity: Not on file  Lifestyle  . Physical activity:    Days per week: Not on file    Minutes per session: Not on file  . Stress: Not on file  Relationships  . Social connections:    Talks on phone: Not on file    Gets together: Not on file    Attends religious service: Not on file    Active member of club or organization: Not on file    Attends meetings of clubs or organizations: Not on file    Relationship status: Not on file  Other Topics Concern  . Not on file  Social History Narrative  . Not on  file     Family History:  The patient's family history includes Cancer (age of onset: 32) in his sister; Heart attack in his father; Heart disease in his father; Hypertension in his mother.   ROS:   Please see the history of present illness.    Review of Systems  Constitution: Negative.  HENT: Negative.   Cardiovascular: Negative.   Respiratory: Negative.   Endocrine: Negative.   Hematologic/Lymphatic: Negative.   Musculoskeletal: Positive for muscle cramps.  Gastrointestinal: Negative.   Genitourinary: Negative.   Neurological: Negative.    All other systems reviewed and are negative.   PHYSICAL EXAM:   VS:  BP 140/64   Pulse 71   Ht 5\' 11"  (1.803 m)   Wt 181 lb (82.1 kg)   SpO2 98%   BMI 25.24 kg/m   Physical Exam  GEN: Elderly, in no acute distress  Neck: no JVD, carotid bruits, or masses Cardiac:RRR; 2/6 systolic murmur at the left sternal border Respiratory: Decreased breath sounds with crackles and wheezing at the right lung base  GI: soft, nontender, nondistended, + BS Ext: without cyanosis, clubbing, or edema, decreased distal pulses bilaterally Neuro:  Alert and Oriented x 3 Psych: euthymic mood, full affect  Wt Readings from Last 3 Encounters:  08/09/17 181 lb (82.1 kg)  04/25/17 179 lb (81.2 kg)  05/24/16 183 lb (83 kg)      Studies/Labs Reviewed:   EKG:  EKG is  ordered today.  The ekg ordered today demonstrates normal sinus rhythm with right bundle branch block no acute change  Recent Labs: No results found for requested labs within last 8760 hours.   Lipid Panel    Component Value Date/Time   CHOL 136 05/25/2016 0911   TRIG 181 (H) 05/25/2016 0911   HDL 39 (L) 05/25/2016 0911   CHOLHDL 3.5 05/25/2016 0911   CHOLHDL 3.2 07/08/2015 0852   VLDL 41 (H) 07/08/2015 0852   LDLCALC 61 05/25/2016 0911    Additional studies/ records that were reviewed today include:   Carotid Duplex (04/25/17): 1-39% stenosis of bilateral ICA. Bilateral  vertebral artery flow is antegrade.  Bilateral subclavian artery waveforms are normal.  No change compared to the exam on 04-19-16.     ABI (Date: 04/25/2017):  R:  ? ABI: 0.60 (was 0.53 on 04-19-16),  ? PT: waveform morphology not documented (was absent) ? DP: waveform morphology not documented (was mono) ? TBI:  0.43 (was 0.44)  L:  ?  ABI: 0.70 (was 0.62),  ? PT: waveform morphology not documented (was mono) ? DP: waveform morphology not documented (was mono)  ? TBI: 0.56 (was 0.51) ? Slightly improved bilateral ABI with moderate disease.     Cardiac catheterization 2016  Final Conclusions:  1. Severe one-vessel coronary artery disease with heavily calcified mid RCA stenosis. There is also moderate mid LAD stenosis. The coronary arteries are overall heavily calcified. 2. Normal LV systolic function by echo. Mildly elevated left ventricular end-diastolic pressure. 3. Successful angioplasty and drug-eluting stent placement 2 to the right coronary artery.       Recommendations:  Dual antiplatelet therapy for at least one year. Aggressive treatment of risk factors and smoking cessation are highly recommended.   Lorine Bears MD, [facility] 06/05/2014, 4:31 PM    ASSESSMENT:    1. Coronary arteriosclerosis   2. Essential hypertension   3. Pure hypercholesterolemia   4. Smoker   5. Claudication of lower extremity (HCC)      PLAN:  In order of problems listed above:  CAD status post DES to the RCA x2 in 2016 without angina.  Continue Plavix.  Dr. Eldridge Dace stopped aspirin last year.  Essential hypertension blood pressure well controlled on metoprolol and amlodipine  Hypercholesterolemia most recent lipid panel looked great done by PCP 06/2017 LDL 46 other labs reviewed as well and were all stable including creatinine and CBC.  Tobacco abuse continues to smoke a pack of cigarettes daily.  Smoking cessation discussed.  Prescription for Chantix given.  Claudication symptoms  followed by [facility] also carotid stenosis    Medication Adjustments/Labs and Tests Ordered: Current medicines are reviewed at length with the patient today.  Concerns regarding medicines are outlined above.  Medication changes, Labs and Tests ordered today are listed in the Patient Instructions below. There are no Patient Instructions on file for this visit.   Elson Clan, PA-C  08/09/2017 9:45 AM    [facility] [address] [place], [place], [place]  [address] Phone: [phone]; Fax: [phone]

## 2017-08-09 ENCOUNTER — Encounter: Payer: Self-pay | Admitting: Physician Assistant

## 2017-08-09 ENCOUNTER — Encounter (INDEPENDENT_AMBULATORY_CARE_PROVIDER_SITE_OTHER): Payer: Self-pay

## 2017-08-09 ENCOUNTER — Ambulatory Visit: Payer: Medicare Other | Admitting: Physician Assistant

## 2017-08-09 VITALS — BP 140/64 | HR 71 | Ht 71.0 in | Wt 181.0 lb

## 2017-08-09 DIAGNOSIS — F17200 Nicotine dependence, unspecified, uncomplicated: Secondary | ICD-10-CM

## 2017-08-09 DIAGNOSIS — I10 Essential (primary) hypertension: Secondary | ICD-10-CM

## 2017-08-09 DIAGNOSIS — I739 Peripheral vascular disease, unspecified: Secondary | ICD-10-CM

## 2017-08-09 DIAGNOSIS — I2510 Atherosclerotic heart disease of native coronary artery without angina pectoris: Secondary | ICD-10-CM | POA: Diagnosis not present

## 2017-08-09 DIAGNOSIS — E7800 Pure hypercholesterolemia, unspecified: Secondary | ICD-10-CM

## 2017-08-09 MED ORDER — VARENICLINE TARTRATE 0.5 MG X 11 & 1 MG X 42 PO MISC: ORAL | 0 refills | Status: DC

## 2017-08-09 NOTE — Patient Instructions (Signed)
Medication Instructions:  Your physician has recommended you make the following change in your medication:   START: chantix for smoking cessation- Take as directed  Labwork: None ordered  Testing/Procedures: None ordered  Follow-Up: Your physician wants you to follow-up in: 1 year with Dr. Eldridge Dace. You will receive a reminder letter in the mail two months in advance. If you don't receive a letter, please call our office to schedule the follow-up appointment.   Any Other Special Instructions Will Be Listed Below (If Applicable).  1-800-QUIT-NOW   Steps to Quit Smoking Smoking tobacco can be bad for your health. It can also affect almost every organ in your body. Smoking puts you and people around you at risk for many serious long-lasting (chronic) diseases. Quitting smoking is hard, but it is one of the best things that you can do for your health. It is never too late to quit. What are the benefits of quitting smoking? When you quit smoking, you lower your risk for getting serious diseases and conditions. They can include:  Lung cancer or lung disease.  Heart disease.  Stroke.  Heart attack.  Not being able to have children (infertility).  Weak bones (osteoporosis) and broken bones (fractures).  If you have coughing, wheezing, and shortness of breath, those symptoms may get better when you quit. You may also get sick less often. If you are pregnant, quitting smoking can help to lower your chances of having a baby of low birth weight. What can I do to help me quit smoking? Talk with your doctor about what can help you quit smoking. Some things you can do (strategies) include:  Quitting smoking totally, instead of slowly cutting back how much you smoke over a period of time.  Going to in-person counseling. You are more likely to quit if you go to many counseling sessions.  Using resources and support systems, such as: ? Agricultural engineer with a Veterinary surgeon. ? Phone  quitlines. ? Automotive engineer. ? Support groups or group counseling. ? Text messaging programs. ? Mobile phone apps or applications.  Taking medicines. Some of these medicines may have nicotine in them. If you are pregnant or breastfeeding, do not take any medicines to quit smoking unless your doctor says it is okay. Talk with your doctor about counseling or other things that can help you.  Talk with your doctor about using more than one strategy at the same time, such as taking medicines while you are also going to in-person counseling. This can help make quitting easier. What things can I do to make it easier to quit? Quitting smoking might feel very hard at first, but there is a lot that you can do to make it easier. Take these steps:  Talk to your family and friends. Ask them to support and encourage you.  Call phone quitlines, reach out to support groups, or work with a Veterinary surgeon.  Ask people who smoke to not smoke around you.  Avoid places that make you want (trigger) to smoke, such as: ? Bars. ? Parties. ? Smoke-break areas at work.  Spend time with people who do not smoke.  Lower the stress in your life. Stress can make you want to smoke. Try these things to help your stress: ? Getting regular exercise. ? Deep-breathing exercises. ? Yoga. ? Meditating. ? Doing a body scan. To do this, close your eyes, focus on one area of your body at a time from head to toe, and notice which parts of your body  are tense. Try to relax the muscles in those areas.  Download or buy apps on your mobile phone or tablet that can help you stick to your quit plan. There are many free apps, such as QuitGuide from the [employer] developer(Centers for Disease Control and Prevention). You can find more support from smokefree.gov and other websites.  This information is not intended to replace advice given to you by your health care provider. Make sure you discuss any questions you have with your health care  provider. Document Released: 11/28/2008 Document Revised: 09/30/2015 Document Reviewed: 06/18/2014 Elsevier Interactive Patient Education  [employer]2018 Elsevier Inc.    If you need a refill on your cardiac medications before your next appointment, please call your pharmacy.

## 2017-08-17 ENCOUNTER — Other Ambulatory Visit: Payer: Self-pay | Admitting: Interventional Cardiology

## 2017-09-23 ENCOUNTER — Other Ambulatory Visit: Payer: Self-pay

## 2017-09-23 ENCOUNTER — Other Ambulatory Visit: Payer: Self-pay | Admitting: Interventional Cardiology

## 2017-09-23 MED ORDER — PANTOPRAZOLE SODIUM 40 MG PO TBEC: 40.0000 mg | DELAYED_RELEASE_TABLET | Freq: Every day | ORAL | 3 refills | Status: DC

## 2018-06-29 ENCOUNTER — Other Ambulatory Visit: Payer: Self-pay | Admitting: Interventional Cardiology

## 2018-07-21 ENCOUNTER — Telehealth: Payer: Self-pay

## 2018-07-21 NOTE — Telephone Encounter (Signed)
Virtual Visit Pre-Appointment Phone Call  "(Name), I am calling you today to discuss your upcoming appointment. We are currently trying to limit exposure to the virus that causes COVID-19 by seeing patients at home rather than in the office."  1. "What is the BEST phone number to call the day of the visit?" - include this in appointment notes  2. "Do you have or have access to (through a family member/friend) a smartphone with video capability that we can use for your visit?" a. If yes - list this number in appt notes as "cell" (if different from BEST phone #) and list the appointment type as a VIDEO visit in appointment notes b. If no - list the appointment type as a PHONE visit in appointment notes  3. Confirm consent - "In the setting of the current Covid19 crisis, you are scheduled for a (phone or video) visit with your provider on (date) at (time).  Just as we do with many in-office visits, in order for you to participate in this visit, we must obtain consent.  If you'd like, I can send this to your mychart (if signed up) or email for you to review.  Otherwise, I can obtain your verbal consent now.  All virtual visits are billed to your insurance company just like a normal visit would be.  By agreeing to a virtual visit, we'd like you to understand that the technology does not allow for your provider to perform an examination, and thus may limit your provider's ability to fully assess your condition. If your provider identifies any concerns that need to be evaluated in person, we will make arrangements to do so.  Finally, though the technology is pretty good, we cannot assure that it will always work on either your or our end, and in the setting of a video visit, we may have to convert it to a phone-only visit.  In either situation, we cannot ensure that we have a secure connection.  Are you willing to proceed?" STAFF: Did the patient verbally acknowledge consent to telehealth visit? Document  YES/NO here: yes  4. Advise patient to be prepared - "Two hours prior to your appointment, go ahead and check your blood pressure, pulse, oxygen saturation, and your weight (if you have the equipment to check those) and write them all down. When your visit starts, your provider will ask you for this information. If you have an Apple Watch or Kardia device, please plan to have heart rate information ready on the day of your appointment. Please have a pen and paper handy nearby the day of the visit as well."  5. Give patient instructions for MyChart download to smartphone OR Doximity/Doxy.me as below if video visit (depending on what platform provider is using)  6. Inform patient they will receive a phone call 15 minutes prior to their appointment time (may be from unknown caller ID) so they should be prepared to answer    TELEPHONE CALL NOTE  Camareon Villacres has been deemed a candidate for a follow-up tele-health visit to limit community exposure during the Covid-19 pandemic. I spoke with the patient via phone to ensure availability of phone/video source, confirm preferred email & phone number, and discuss instructions and expectations.  I reminded Zayid Kroeker to be prepared with any vital sign and/or heart rhythm information that could potentially be obtained via home monitoring, at the time of his visit. I reminded Gerone Grossheim to expect a phone call prior to his visit.  Clide DalesDanielle M Oliviarose Punch, CMA 07/21/2018 3:45 PM   INSTRUCTIONS FOR DOWNLOADING THE MYCHART APP TO SMARTPHONE  - The patient must first make sure to have activated MyChart and know their login information - If Apple, go to [employer] Store and type in MyChart in the search bar and download the app. If Android, ask patient to go to [employer] Play Store and type in [facility] in the search bar and download the app. The app is free but as with any other app downloads, their phone may require them to verify saved payment information or  Apple/Android password.  - The patient will need to then log into the app with their MyChart username and password, and select [facility] as their healthcare provider to link the account. When it is time for your visit, go to the MyChart app, find appointments, and click Begin Video Visit. Be sure to Select Allow for your device to access the Microphone and Camera for your visit. You will then be connected, and your provider will be with you shortly.  **If they have any issues connecting, or need assistance please contact MyChart service desk [phone])**  **If using a computer, in order to ensure the best quality for their visit they will need to use either of the following Internet Browsers: [employer] Edge, or Google Chrome**  IF USING DOXIMITY or DOXY.ME - The patient will receive a link just prior to their visit by text.     FULL LENGTH CONSENT FOR TELE-HEALTH VISIT   I hereby voluntarily request, consent and authorize [facility] and its employed or contracted physicians, physician assistants, nurse practitioners or other licensed health care professionals (the Practitioner), to provide me with telemedicine health care services (the "Services") as deemed necessary by the treating Practitioner. I acknowledge and consent to receive the Services by the Practitioner via telemedicine. I understand that the telemedicine visit will involve communicating with the Practitioner through live audiovisual communication technology and the disclosure of certain medical information by electronic transmission. I acknowledge that I have been given the opportunity to request an in-person assessment or other available alternative prior to the telemedicine visit and am voluntarily participating in the telemedicine visit.  I understand that I have the right to withhold or withdraw my consent to the use of telemedicine in the course of my care at any time, without affecting my right to future care  or treatment, and that the Practitioner or I may terminate the telemedicine visit at any time. I understand that I have the right to inspect all information obtained and/or recorded in the course of the telemedicine visit and may receive copies of available information for a reasonable fee.  I understand that some of the potential risks of receiving the Services via telemedicine include:  Marland Kitchen. Delay or interruption in medical evaluation due to technological equipment failure or disruption; . Information transmitted may not be sufficient (e.g. poor resolution of images) to allow for appropriate medical decision making by the Practitioner; and/or  . In rare instances, security protocols could fail, causing a breach of personal health information.  Furthermore, I acknowledge that it is my responsibility to provide information about my medical history, conditions and care that is complete and accurate to the best of my ability. I acknowledge that Practitioner's advice, recommendations, and/or decision may be based on factors not within their control, such as incomplete or inaccurate data provided by me or distortions of diagnostic images or specimens that may result from electronic transmissions. I understand that  the practice of medicine is not an exact science and that Practitioner makes no warranties or guarantees regarding treatment outcomes. I acknowledge that I will receive a copy of this consent concurrently upon execution via email to the email address I last provided but may also request a printed copy by calling the office of [place].    I understand that my insurance will be billed for this visit.   I have read or had this consent read to me. . I understand the contents of this consent, which adequately explains the benefits and risks of the Services being provided via telemedicine.  . I have been provided ample opportunity to ask questions regarding this consent and the Services and have had  my questions answered to my satisfaction. . I give my informed consent for the services to be provided through the use of telemedicine in my medical care  By participating in this telemedicine visit I agree to the above.

## 2018-07-28 NOTE — Progress Notes (Signed)
Virtual Visit via Video Note   This visit type was conducted due to national recommendations for restrictions regarding the COVID-19 Pandemic (e.g. social distancing) in an effort to limit this patient's exposure and mitigate transmission in our community.  Due to his co-morbid illnesses, this patient is at least at moderate risk for complications without adequate follow up.  This format is felt to be most appropriate for this patient at this time.  All issues noted in this document were discussed and addressed.  A limited physical exam was performed with this format.  Please refer to the patient's chart for his consent to telehealth for [facility].   Patient unable to access camera Date:  07/31/2018   ID:  Alex Mullins, DOB July 30, 1945, MRN [id number]  Patient Location: Home Provider Location: Home  PCP:  Delilah Shan, MD  Cardiologist:  Larae Grooms, MD  Electrophysiologist:  None   Evaluation Performed:  Follow-Up Visit  Chief Complaint:  CAD  History of Present Illness:    Alex Mullins is a 73 y.o. male with with history of CAD status post DES to the RCA in 2016, tobacco abuse, hypertension, claudication symptoms followed by [facility] recommend walking. I saw him in4/2018 at which time he stopped Brilinta and started Plavix 75 mg daily.  Smoking cessation recommended.  Rx for Chantix given at that time. He was able to cut back with Chantix, but when he ran out, he started smoking more.   He is only doing a little walking.  Walking limited by claudication.  No nonhealing sores on his feet.   Denies : Chest pain. Dizziness. Leg edema. Nitroglycerin use. Orthopnea. Palpitations. Paroxysmal nocturnal dyspnea. Shortness of breath. Syncope.   The patient does not have symptoms concerning for COVID-19 infection (fever, chills, cough, or new shortness of breath).    Past Medical History:  Diagnosis Date  . Carotid artery occlusion   . COPD (chronic obstructive pulmonary  disease) ([place])   . Hyperlipidemia   . Hypertension   . RBBB (right bundle branch block)   . Unstable angina ([place]) 06/04/2014   DES 2 to the RCA  . Vitamin D deficiency    Past Surgical History:  Procedure Laterality Date  . CORONARY STENT PLACEMENT  06/05/2014   3.0 x 38 mm overlapped with a 3.5 x 20 mm Promus DES to the RCA  . HERNIA REPAIR    . INGUINAL HERNIA REPAIR  2008   right  . LEFT HEART CATHETERIZATION WITH CORONARY ANGIOGRAM N/A 06/05/2014   Procedure: LEFT HEART CATHETERIZATION WITH CORONARY ANGIOGRAM;  Surgeon: Wellington Hampshire, MD; LAD 20/60/20%, D1 30%, CFX 20%, OM to 20%, RCA 99/80%>>0% w/  3.0 x 38 mm overlapped with a 3.5 x 20 mm Promus DES  . SPINE SURGERY  1982     Current Meds  Medication Sig  . amLODipine (NORVASC) 10 MG tablet TAKE 1 TABLET BY MOUTH  DAILY  . atorvastatin (LIPITOR) 80 MG tablet TAKE 1 TABLET BY MOUTH AT  BEDTIME  . Cholecalciferol (VITAMIN D PO) Take 1 tablet by mouth daily.  . clopidogrel (PLAVIX) 75 MG tablet TAKE 1 TABLET BY MOUTH  DAILY  . metoprolol tartrate (LOPRESSOR) 25 MG tablet TAKE ONE-HALF TABLET BY  MOUTH TWO TIMES DAILY  . nitroGLYCERIN (NITROLINGUAL) 0.4 MG/SPRAY spray Place 1 spray under the tongue every 5 (five) minutes x 3 doses as needed for chest pain.     Allergies:   Patient has no known allergies.   Social History  Tobacco Use  . Smoking status: Current Every Day Smoker    Packs/day: 1.00    Years: 50.00    Pack years: 50.00    Types: Cigarettes  . Smokeless tobacco: Never Used  . Tobacco comment: recently tried Nicoderm patches; BP increased, so stopped the patch  Substance Use Topics  . Alcohol use: Yes    Alcohol/week: 5.0 standard drinks    Types: 5 Shots of liquor per week    Comment: 3 drinks per day  . Drug use: No     Family Hx: The patient's family history includes Cancer (age of onset: 743) in his sister; Heart attack in his father; Heart disease in his father; Hypertension in his mother.   ROS:   Please see the history of present illness.    Le pain with walking All other systems reviewed and are negative.   Prior CV studies:   The following studies were reviewed today:  As noted above  Labs/Other Tests and Data Reviewed:    EKG:  An ECG dated 6/19 was personally reviewed today and demonstrated:  NSR, RBBB  Recent Labs: No results found for requested labs within last 8760 hours.   Recent Lipid Panel Lab Results  Component Value Date/Time   CHOL 136 05/25/2016 09:11 AM   TRIG 181 (H) 05/25/2016 09:11 AM   HDL 39 (L) 05/25/2016 09:11 AM   CHOLHDL 3.5 05/25/2016 09:11 AM   CHOLHDL 3.2 07/08/2015 08:52 AM   LDLCALC 61 05/25/2016 09:11 AM    Wt Readings from Last 3 Encounters:  07/31/18 185 lb (83.9 kg)  08/09/17 181 lb (82.1 kg)  04/25/17 179 lb (81.2 kg)     Objective:    Vital Signs:  BP 122/70   Pulse 90   Ht 5\' 11"  (1.803 m)   Wt 185 lb (83.9 kg)   BMI 25.80 kg/m    VITAL SIGNS:  reviewed GEN:  no acute distress RESPIRATORY:  no shortness of breath PSYCH:  flat affect exam limited by phone format  ASSESSMENT & PLAN:    1. CAD: Doing well with aggressive secondary prevention.   2. HTN: The current medical regimen is effective;  continue present plan and medications. BP at home is the 122-135 systolic range. 3. Hyperlipidemia: Needs lipids rechecked.  4. Tobacco abuse: Try Chantix again.   COVID-19 Education: The signs and symptoms of COVID-19 were discussed with the patient and how to seek care for testing (follow up with PCP or arrange E-visit).  The importance of social distancing was discussed today.  Time:   Today, I have spent 15 minutes with the patient with telehealth technology discussing the above problems.     Medication Adjustments/Labs and Tests Ordered: Current medicines are reviewed at length with the patient today.  Concerns regarding medicines are outlined above.   Tests Ordered: No orders of the defined types were  placed in this encounter.   Medication Changes: No orders of the defined types were placed in this encounter.   Follow Up:  Virtual Visit or In Person in 1 year(s)  Signed, Lance MussJayadeep Netanya Yazdani, MD  07/31/2018 9:40 AM    [facility]

## 2018-07-31 ENCOUNTER — Other Ambulatory Visit: Payer: Self-pay

## 2018-07-31 ENCOUNTER — Encounter: Payer: Self-pay | Admitting: Interventional Cardiology

## 2018-07-31 ENCOUNTER — Telehealth (INDEPENDENT_AMBULATORY_CARE_PROVIDER_SITE_OTHER): Payer: Medicare Other | Admitting: Interventional Cardiology

## 2018-07-31 ENCOUNTER — Other Ambulatory Visit: Payer: Self-pay | Admitting: Interventional Cardiology

## 2018-07-31 VITALS — BP 122/70 | HR 90 | Ht 71.0 in | Wt 185.0 lb

## 2018-07-31 DIAGNOSIS — F17200 Nicotine dependence, unspecified, uncomplicated: Secondary | ICD-10-CM | POA: Diagnosis not present

## 2018-07-31 DIAGNOSIS — I2510 Atherosclerotic heart disease of native coronary artery without angina pectoris: Secondary | ICD-10-CM

## 2018-07-31 DIAGNOSIS — I10 Essential (primary) hypertension: Secondary | ICD-10-CM

## 2018-07-31 DIAGNOSIS — E7800 Pure hypercholesterolemia, unspecified: Secondary | ICD-10-CM

## 2018-07-31 MED ORDER — CHANTIX STARTING MONTH PAK 0.5 MG X 11 & 1 MG X 42 PO TABS: ORAL_TABLET | ORAL | 0 refills | Status: AC

## 2018-07-31 NOTE — Patient Instructions (Addendum)
Medication Instructions:  Your physician recommends that you continue on your current medications as directed. Please refer to the Current Medication list given to you today.  A prescription  If you need a refill on your cardiac medications before your next appointment, please call your pharmacy.   Lab work: Your physician recommends that you return for a FASTING lipid profile and complete metabolic panel on 5/85/27 at 10:00 AM   If you have labs (blood work) drawn today and your tests are completely normal, you will receive your results only by: Marland Kitchen MyChart Message (if you have MyChart) OR . A paper copy in the mail If you have any lab test that is abnormal or we need to change your treatment, we will call you to review the results.  Testing/Procedures: None ordered  Follow-Up: At [facility], you and your health needs are our priority.  As part of our continuing mission to provide you with exceptional heart care, we have created designated Provider Care Teams.  These Care Teams include your primary Cardiologist (physician) and Advanced Practice Providers (APPs -  Physician Assistants and Nurse Practitioners) who all work together to provide you with the care you need, when you need it. . You will need a follow up appointment in 1 year.  Please call our office 2 months in advance to schedule this appointment.  You may see Casandra Doffing, MD or one of the following Advanced Practice Providers on your designated Care Team:   . Lyda Jester, PA-C . Dayna Dunn, PA-C . Ermalinda Barrios, PA-C  Any Other Special Instructions Will Be Listed Below (If Applicable).

## 2018-08-01 ENCOUNTER — Telehealth: Payer: Self-pay

## 2018-08-01 NOTE — Telephone Encounter (Signed)
    COVID-19 Pre-Screening Questions:  . In the past 7 to 10 days have you had a cough,  shortness of breath, headache, congestion, fever (100 or greater) body aches, chills, sore throat, or sudden loss of taste or sense of smell? . Have you been around anyone with known Covid 19. . Have you been around anyone who is awaiting Covid 19 test results in the past 7 to 10 days? . Have you been around anyone who has been exposed to Covid 19, or has mentioned symptoms of Covid 19 within the past 7 to 10 days?  If you have any concerns/questions about symptoms patients report during screening (either on the phone or at threshold). Contact the provider seeing the patient or DOD for further guidance.  If neither are available contact a member of the leadership team.        Pt answered NO to all questions. [phone] 08/01/2018

## 2018-08-02 ENCOUNTER — Other Ambulatory Visit: Payer: Medicare Other | Admitting: *Deleted

## 2018-08-02 ENCOUNTER — Other Ambulatory Visit: Payer: Self-pay

## 2018-08-02 DIAGNOSIS — I2510 Atherosclerotic heart disease of native coronary artery without angina pectoris: Secondary | ICD-10-CM

## 2018-08-02 DIAGNOSIS — I10 Essential (primary) hypertension: Secondary | ICD-10-CM

## 2018-08-02 DIAGNOSIS — E7800 Pure hypercholesterolemia, unspecified: Secondary | ICD-10-CM

## 2018-08-02 DIAGNOSIS — F17200 Nicotine dependence, unspecified, uncomplicated: Secondary | ICD-10-CM

## 2018-08-02 LAB — LIPID PANEL
Chol/HDL Ratio: 2.9 ratio (ref 0.0–5.0)
Cholesterol, Total: 159 mg/dL (ref 100–199)
HDL: 55 mg/dL (ref >39)
LDL Calculated: 83 mg/dL (ref 0–99)
Triglycerides: 103 mg/dL (ref 0–149)
VLDL Cholesterol Cal: 21 mg/dL (ref 5–40)

## 2018-08-02 LAB — COMPREHENSIVE METABOLIC PANEL
ALT: 14 IU/L (ref 0–44)
AST: 15 IU/L (ref 0–40)
Albumin/Globulin Ratio: 1.8 (ref 1.2–2.2)
Albumin: 4.4 g/dL (ref 3.7–4.7)
Alkaline Phosphatase: 92 IU/L (ref 39–117)
BUN/Creatinine Ratio: 9 — ABNORMAL LOW (ref 10–24)
BUN: 12 mg/dL (ref 8–27)
Bilirubin Total: 1 mg/dL (ref 0.0–1.2)
CO2: 25 mmol/L (ref 20–29)
Calcium: 9.5 mg/dL (ref 8.6–10.2)
Chloride: 101 mmol/L (ref 96–106)
Creatinine, Ser: 1.39 mg/dL — ABNORMAL HIGH (ref 0.76–1.27)
GFR calc Af Amer: 58 mL/min/{1.73_m2} — ABNORMAL LOW (ref >59)
GFR calc non Af Amer: 50 mL/min/{1.73_m2} — ABNORMAL LOW (ref >59)
Globulin, Total: 2.5 g/dL (ref 1.5–4.5)
Glucose: 112 mg/dL — ABNORMAL HIGH (ref 65–99)
Potassium: 4.1 mmol/L (ref 3.5–5.2)
Sodium: 141 mmol/L (ref 134–144)
Total Protein: 6.9 g/dL (ref 6.0–8.5)

## 2018-09-18 ENCOUNTER — Other Ambulatory Visit: Payer: Self-pay | Admitting: Interventional Cardiology

## 2018-09-19 NOTE — Telephone Encounter (Signed)
Pt's pharmacy is requesting a refill on pantoprazole 40 mg tablet. This medication is no longer on pt's medication list. Would Dr. Irish Lack like to refill this medication? Please address

## 2018-09-29 NOTE — Telephone Encounter (Signed)
Dr. Irish Lack, would you like to refill protonix for this patient?

## 2018-10-26 NOTE — Telephone Encounter (Signed)
Refill sent in

## 2018-10-26 NOTE — Telephone Encounter (Signed)
OK to refill

## 2019-05-08 ENCOUNTER — Other Ambulatory Visit: Payer: Self-pay | Admitting: Interventional Cardiology

## 2019-07-28 ENCOUNTER — Other Ambulatory Visit: Payer: Self-pay | Admitting: Interventional Cardiology

## 2019-08-06 NOTE — Progress Notes (Deleted)
Cardiology Office Note   Date:  08/06/2019   ID:  Zyheir Daft, DOB April 16, 1945, MRN [id number]  PCP:  Delilah Shan, MD    No chief complaint on file.    Wt Readings from Last 3 Encounters:  07/31/18 185 lb (83.9 kg)  08/09/17 181 lb (82.1 kg)  04/25/17 179 lb (81.2 kg)       History of Present Illness: Alex Mullins is a 74 y.o. male  with history of CAD status post DES to the RCA in 2016, tobacco abuse, hypertension, claudication symptoms followed by VVS recommend walking. I saw him in4/2018 at which time he stopped Brilinta and started Plavix 75 mg daily. Smoking cessation recommended.  Rx for Chantix given at that time. He was able to cut back with Chantix, but when he ran out, he started smoking more.   He is only doing a little walking.  Walking limited by claudication.     Past Medical History:  Diagnosis Date  . Carotid artery occlusion   . COPD (chronic obstructive pulmonary disease) ([place])   . Hyperlipidemia   . Hypertension   . RBBB (right bundle branch block)   . Unstable angina ([place]) 06/04/2014   DES 2 to the RCA  . Vitamin D deficiency     Past Surgical History:  Procedure Laterality Date  . CORONARY STENT PLACEMENT  06/05/2014   3.0 x 38 mm overlapped with a 3.5 x 20 mm Promus DES to the RCA  . HERNIA REPAIR    . INGUINAL HERNIA REPAIR  2008   right  . LEFT HEART CATHETERIZATION WITH CORONARY ANGIOGRAM N/A 06/05/2014   Procedure: LEFT HEART CATHETERIZATION WITH CORONARY ANGIOGRAM;  Surgeon: Wellington Hampshire, MD; LAD 20/60/20%, D1 30%, CFX 20%, OM to 20%, RCA 99/80%>>0% w/  3.0 x 38 mm overlapped with a 3.5 x 20 mm Promus DES  . SPINE SURGERY  1982     Current Outpatient Medications  Medication Sig Dispense Refill  . amLODipine (NORVASC) 10 MG tablet Take 1 tablet (10 mg total) by mouth daily. Must keep appt for further refills 90 tablet 0  . atorvastatin (LIPITOR) 80 MG tablet TAKE 1 TABLET BY MOUTH AT  BEDTIME 90 tablet 0  .  Cholecalciferol (VITAMIN D PO) Take 1 tablet by mouth daily.    . clopidogrel (PLAVIX) 75 MG tablet TAKE 1 TABLET BY MOUTH  DAILY 90 tablet 3  . metoprolol tartrate (LOPRESSOR) 25 MG tablet TAKE ONE-HALF TABLET BY  MOUTH TWICE DAILY 90 tablet 3  . nitroGLYCERIN (NITROLINGUAL) 0.4 MG/SPRAY spray Place 1 spray under the tongue every 5 (five) minutes x 3 doses as needed for chest pain. 12 g 12  . pantoprazole (PROTONIX) 40 MG tablet TAKE 1 TABLET BY MOUTH  DAILY. PLEASE KEEP UPCOMING APPT FOR FUTURE REFILLS.  THANK YOU 90 tablet 3  . varenicline (CHANTIX STARTING MONTH PAK) 0.5 MG X 11 & 1 MG X 42 tablet Take one 0.5 mg tablet by mouth once daily for 3 days, then increase to one 0.5 mg tablet twice daily for 4 days, then increase to one 1 mg tablet twice daily. 53 tablet 0   No current facility-administered medications for this visit.    Allergies:   Patient has no known allergies.    Social History:  The patient  reports that he has been smoking cigarettes. He has a 50.00 pack-year smoking history. He has never used smokeless tobacco. He reports current alcohol use of about 5.0 standard  drinks of alcohol per week. He reports that he does not use drugs.   Family History:  The patient's ***family history includes Cancer (age of onset: 62) in his sister; Heart attack in his father; Heart disease in his father; Hypertension in his mother.    ROS:  Please see the history of present illness.   Otherwise, review of systems are positive for ***.   All other systems are reviewed and negative.    PHYSICAL EXAM: VS:  There were no vitals taken for this visit. , BMI There is no height or weight on file to calculate BMI. GEN: Well nourished, well developed, in no acute distress HEENT: normal Neck: no JVD, carotid bruits, or masses Cardiac: ***RRR; no murmurs, rubs, or gallops,no edema  Respiratory:  clear to auscultation bilaterally, normal work of breathing GI: soft, nontender, nondistended, + BS MS:  no deformity or atrophy Skin: warm and dry, no rash Neuro:  Strength and sensation are intact Psych: euthymic mood, full affect   EKG:   The ekg ordered today demonstrates ***   Recent Labs: No results found for requested labs within last 8760 hours.   Lipid Panel    Component Value Date/Time   CHOL 159 08/02/2018 1002   TRIG 103 08/02/2018 1002   HDL 55 08/02/2018 1002   CHOLHDL 2.9 08/02/2018 1002   CHOLHDL 3.2 07/08/2015 0852   VLDL 41 (H) 07/08/2015 0852   LDLCALC 83 08/02/2018 1002     Other studies Reviewed: Additional studies/ records that were reviewed today with results demonstrating: ***.   ASSESSMENT AND PLAN:  1. CAD:   2. HTN: 3. Hyperlipidemia: 4. Tobacco abuse: 5. PAD/claudication:   Current medicines are reviewed at length with the patient today.  The patient concerns regarding his medicines were addressed.  The following changes have been made:  No change***  Labs/ tests ordered today include: *** No orders of the defined types were placed in this encounter.   Recommend 150 minutes/week of aerobic exercise Low fat, low carb, high fiber diet recommended  Disposition:   FU in ***   Signed, Lance Muss, MD  08/06/2019 11:43 PM    [facility] [address] [place], [place], [place]  [address] Phone: [phone]; Fax: [phone]

## 2019-08-07 ENCOUNTER — Other Ambulatory Visit: Payer: Self-pay

## 2019-08-07 ENCOUNTER — Ambulatory Visit: Payer: Medicare Other | Admitting: Interventional Cardiology

## 2019-08-07 ENCOUNTER — Encounter: Payer: Self-pay | Admitting: Interventional Cardiology

## 2019-08-07 VITALS — BP 114/62 | HR 103 | Ht 71.0 in | Wt 171.0 lb

## 2019-08-07 DIAGNOSIS — I2510 Atherosclerotic heart disease of native coronary artery without angina pectoris: Secondary | ICD-10-CM | POA: Diagnosis not present

## 2019-08-07 DIAGNOSIS — I739 Peripheral vascular disease, unspecified: Secondary | ICD-10-CM

## 2019-08-07 DIAGNOSIS — I4891 Unspecified atrial fibrillation: Secondary | ICD-10-CM

## 2019-08-07 DIAGNOSIS — I10 Essential (primary) hypertension: Secondary | ICD-10-CM | POA: Diagnosis not present

## 2019-08-07 DIAGNOSIS — E7800 Pure hypercholesterolemia, unspecified: Secondary | ICD-10-CM

## 2019-08-07 DIAGNOSIS — F17200 Nicotine dependence, unspecified, uncomplicated: Secondary | ICD-10-CM

## 2019-08-07 LAB — COMPREHENSIVE METABOLIC PANEL
ALT: 16 IU/L (ref 0–44)
AST: 23 IU/L (ref 0–40)
Albumin/Globulin Ratio: 1.4 (ref 1.2–2.2)
Albumin: 3.8 g/dL (ref 3.7–4.7)
Alkaline Phosphatase: 163 IU/L — ABNORMAL HIGH (ref 48–121)
BUN/Creatinine Ratio: 11 (ref 10–24)
BUN: 14 mg/dL (ref 8–27)
Bilirubin Total: 1 mg/dL (ref 0.0–1.2)
CO2: 24 mmol/L (ref 20–29)
Calcium: 8.6 mg/dL (ref 8.6–10.2)
Chloride: 100 mmol/L (ref 96–106)
Creatinine, Ser: 1.29 mg/dL — ABNORMAL HIGH (ref 0.76–1.27)
GFR calc Af Amer: 63 mL/min/{1.73_m2} (ref >59)
GFR calc non Af Amer: 54 mL/min/{1.73_m2} — ABNORMAL LOW (ref >59)
Globulin, Total: 2.8 g/dL (ref 1.5–4.5)
Glucose: 112 mg/dL — ABNORMAL HIGH (ref 65–99)
Potassium: 4 mmol/L (ref 3.5–5.2)
Sodium: 140 mmol/L (ref 134–144)
Total Protein: 6.6 g/dL (ref 6.0–8.5)

## 2019-08-07 LAB — TSH: TSH: 2.3 u[IU]/mL (ref 0.450–4.500)

## 2019-08-07 LAB — LIPID PANEL
Chol/HDL Ratio: 3.1 ratio (ref 0.0–5.0)
Cholesterol, Total: 166 mg/dL (ref 100–199)
HDL: 53 mg/dL (ref >39)
LDL Chol Calc (NIH): 95 mg/dL (ref 0–99)
Triglycerides: 98 mg/dL (ref 0–149)
VLDL Cholesterol Cal: 18 mg/dL (ref 5–40)

## 2019-08-07 LAB — CBC
Hematocrit: 40.5 % (ref 37.5–51.0)
Hemoglobin: 14.3 g/dL (ref 13.0–17.7)
MCH: 33.6 pg — ABNORMAL HIGH (ref 26.6–33.0)
MCHC: 35.3 g/dL (ref 31.5–35.7)
MCV: 95 fL (ref 79–97)
Platelets: 247 10*3/uL (ref 150–450)
RBC: 4.25 x10E6/uL (ref 4.14–5.80)
RDW: 12.6 % (ref 11.6–15.4)
WBC: 8.9 10*3/uL (ref 3.4–10.8)

## 2019-08-07 MED ORDER — METOPROLOL TARTRATE 25 MG PO TABS: 25.0000 mg | ORAL_TABLET | Freq: Two times a day (BID) | ORAL | 3 refills | Status: DC

## 2019-08-07 MED ORDER — PANTOPRAZOLE SODIUM 40 MG PO TBEC: DELAYED_RELEASE_TABLET | ORAL | 3 refills | Status: DC

## 2019-08-07 MED ORDER — APIXABAN 5 MG PO TABS: 5.0000 mg | ORAL_TABLET | Freq: Two times a day (BID) | ORAL | 11 refills | Status: AC

## 2019-08-07 MED ORDER — AMLODIPINE BESYLATE 10 MG PO TABS: 10.0000 mg | ORAL_TABLET | Freq: Every day | ORAL | 3 refills | Status: DC

## 2019-08-07 MED ORDER — APIXABAN 5 MG PO TABS: 5.0000 mg | ORAL_TABLET | Freq: Two times a day (BID) | ORAL | 11 refills | Status: DC

## 2019-08-07 MED ORDER — ATORVASTATIN CALCIUM 80 MG PO TABS: 80.0000 mg | ORAL_TABLET | Freq: Every day | ORAL | 3 refills | Status: DC

## 2019-08-07 NOTE — Progress Notes (Signed)
Cardiology Office Note   Date:  08/07/2019   ID:  Alex Mullins, DOB [birth year], MRN [id number]  PCP:  Alex Shan, MD    No chief complaint on file.  CAD  Wt Readings from Last 3 Encounters:  08/07/19 171 lb (77.6 kg)  07/31/18 185 lb (83.9 kg)  08/09/17 181 lb (82.1 kg)       History of Present Illness: Alex Mullins is a 74 y.o. male  with history of CAD status post DES to the RCA in 2016, tobacco abuse, hypertension, claudication symptoms followed by VVS recommend walking. I saw him in4/2018 at which time he stopped Brilinta and started Plavix 75 mg daily. Smoking cessation recommended.  Rx for Chantix given at that time. He was able to cut back with Chantix, but when he ran out, he started smoking more.   He is only doing a little walking.  Walking limited by claudication. No nonhealing ulcers.   Uses a pedalling device, but admits to being lazy as to why he does not do more.   Denies : Chest pain. Dizziness. Leg edema. Nitroglycerin use. Orthopnea. Palpitations. Paroxysmal nocturnal dyspnea. Shortness of breath. Syncope.     Past Medical History:  Diagnosis Date  . Carotid artery occlusion   . COPD (chronic obstructive pulmonary disease) ([place])   . Hyperlipidemia   . Hypertension   . RBBB (right bundle branch block)   . Unstable angina ([place]) 06/04/2014   DES 2 to the RCA  . Vitamin D deficiency     Past Surgical History:  Procedure Laterality Date  . CORONARY STENT PLACEMENT  06/05/2014   3.0 x 38 mm overlapped with a 3.5 x 20 mm Promus DES to the RCA  . HERNIA REPAIR    . INGUINAL HERNIA REPAIR  2008   right  . LEFT HEART CATHETERIZATION WITH CORONARY ANGIOGRAM N/A 06/05/2014   Procedure: LEFT HEART CATHETERIZATION WITH CORONARY ANGIOGRAM;  Surgeon: Alex Hampshire, MD; LAD 20/60/20%, D1 30%, CFX 20%, OM to 20%, RCA 99/80%>>0% w/  3.0 x 38 mm overlapped with a 3.5 x 20 mm Promus DES  . SPINE SURGERY  1982     Current Outpatient  Medications  Medication Sig Dispense Refill  . amLODipine (NORVASC) 10 MG tablet Take 1 tablet (10 mg total) by mouth daily. Must keep appt for further refills 90 tablet 0  . atorvastatin (LIPITOR) 80 MG tablet TAKE 1 TABLET BY MOUTH AT  BEDTIME 90 tablet 0  . Cholecalciferol (VITAMIN D PO) Take 1 tablet by mouth daily.    . clopidogrel (PLAVIX) 75 MG tablet TAKE 1 TABLET BY MOUTH  DAILY 90 tablet 3  . metoprolol tartrate (LOPRESSOR) 25 MG tablet TAKE ONE-HALF TABLET BY  MOUTH TWICE DAILY 90 tablet 3  . nitroGLYCERIN (NITROLINGUAL) 0.4 MG/SPRAY spray Place 1 spray under the tongue every 5 (five) minutes x 3 doses as needed for chest pain. 12 g 12  . pantoprazole (PROTONIX) 40 MG tablet TAKE 1 TABLET BY MOUTH  DAILY. PLEASE KEEP UPCOMING APPT FOR FUTURE REFILLS.  THANK YOU 90 tablet 3  . varenicline (CHANTIX STARTING MONTH PAK) 0.5 MG X 11 & 1 MG X 42 tablet Take one 0.5 mg tablet by mouth once daily for 3 days, then increase to one 0.5 mg tablet twice daily for 4 days, then increase to one 1 mg tablet twice daily. 53 tablet 0   No current facility-administered medications for this visit.    Allergies:   Patient  has no known allergies.    Social History:  The patient  reports that he has been smoking cigarettes. He has a 50.00 pack-year smoking history. He has never used smokeless tobacco. He reports current alcohol use of about 5.0 standard drinks of alcohol per week. He reports that he does not use drugs.   Family History:  The patient's family history includes Cancer (age of onset: 55) in his sister; Heart attack in his father; Heart disease in his father; Hypertension in his mother.    ROS:  Please see the history of present illness.   Otherwise, review of systems are positive for fatigue, leg pain with walking.   All other systems are reviewed and negative.    PHYSICAL EXAM: VS:  BP 114/62   Pulse (!) 103   Ht 5\' 11"  (1.803 m)   Wt 171 lb (77.6 kg)   SpO2 95%   BMI 23.85 kg/m  ,  BMI Body mass index is 23.85 kg/m. GEN: Well nourished, well developed, in no acute distress  HEENT: normal  Neck: no JVD, carotid bruits, or masses Cardiac: Irregularly irregular; no murmurs, rubs, or gallops,no edema  Respiratory:  clear to auscultation bilaterally, normal work of breathing GI: soft, nontender, nondistended, + BS MS: no deformity or atrophy  Skin: warm and dry, no rash Neuro:  Strength and sensation are intact Psych: euthymic mood, full affect   EKG:   The ekg ordered today demonstrates atrial fibrillation, right bundle branch block pattern, rapid ventricular response   Recent Labs: No results found for requested labs within last 8760 hours.   Lipid Panel    Component Value Date/Time   CHOL 159 08/02/2018 1002   TRIG 103 08/02/2018 1002   HDL 55 08/02/2018 1002   CHOLHDL 2.9 08/02/2018 1002   CHOLHDL 3.2 07/08/2015 0852   VLDL 41 (H) 07/08/2015 0852   LDLCALC 83 08/02/2018 1002     Other studies Reviewed: Additional studies/ records that were reviewed today with results demonstrating: .   ASSESSMENT AND PLAN:  1. CAD: No angina on medical therapy.  Continue aggressive secondary prevention.   2. New onset atrial fibrillation: Heart rate over 100.  Will increase metoprolol to 25 mg twice a day.  Start Eliquis 5 twice daily.  Stop clopidogrel.  Check echocardiogram and TSH along with other labs.  He is not very symptomatic at rest.  He does report a poor exercise tolerance which may in part be due to atrial fibrillation.  We also briefly discussed potential cardioversion in 3 to 4 weeks after anticoagulation. 3. HTN: The current medical regimen is effective;  continue present plan and medications. 4. Hyperlipidemia: LDL 83 in 2020. Recheck lipids today.  Continue lipid-lowering therapy with atorvastatin. 5. Tobacco abuse: He needs to stop smoking.  Had some success with Chantix in the past.  After we have made the changes in regards to his atrial  fibrillation, will readdress tobacco cessation. 6. PAD/claudication: No nonhealing sores.  I encouraged him to try to walk more and uses pedaling device to improve blood flow to his legs.   Current medicines are reviewed at length with the patient today.  The patient concerns regarding his medicines were addressed.  The following changes have been made: As noted above  Labs/ tests ordered today include:  No orders of the defined types were placed in this encounter.   Recommend 150 minutes/week of aerobic exercise Low fat, low carb, high fiber diet recommended  Disposition:   FU in  2-3 weeks   Signed, Lance Muss, MD  08/07/2019 9:40 AM    [facility] [address] [place], [place], [place]  [address] Phone: [phone]; Fax: [phone]

## 2019-08-07 NOTE — Patient Instructions (Addendum)
Medication Instructions:  Your physician has recommended you make the following change in your medication:   1. STOP: clopidogrel (plavix) 75 mg tablet  2. START: eliquis 5 mg tablet: Take 1 tablet by mouth twice a day  3. INCREASE: metoprolol tartrate (lopressor) to 25 mg by mouth twice a day  *If you need a refill on your cardiac medications before your next appointment, please call your pharmacy*   Lab Work: TODAY: CBC, CMET, TSH, LIPIDS  If you have labs (blood work) drawn today and your tests are completely normal, you will receive your results only by: Marland Kitchen MyChart Message (if you have MyChart) OR . A paper copy in the mail If you have any lab test that is abnormal or we need to change your treatment, we will call you to review the results.   Testing/Procedures: Your physician has requested that you have an echocardiogram on 08/27/19 at 3:05 PM. Please arrive at 2:50 PM. Echocardiography is a painless test that uses sound waves to create images of your heart. It provides your doctor with information about the size and shape of your heart and how well your heart's chambers and valves are working. This procedure takes approximately one hour. There are no restrictions for this procedure.  Follow-Up: Follow up with Jacolyn Reedy, PA on 08/29/19 at 11:45 AM  Other Instructions If you notice any bleeding such as blood in stool, black tarry stools, blood in urine, nosebleeds or any other unusual bleeding, call your doctor immediately. It is not normal to have this kind of bleeding while on a blood thinner and usually indicates there is an underlying problem with one of your body systems that needs to be checked out.    Patients taking blood thinners should generally stay away from medicines like ibuprofen, Advil, Motrin, naproxen, and Aleve due to risk of stomach bleeding. You may take Tylenol as directed or talk to your primary doctor about alternatives.

## 2019-08-27 ENCOUNTER — Other Ambulatory Visit (HOSPITAL_COMMUNITY): Payer: Medicare Other

## 2019-08-29 ENCOUNTER — Ambulatory Visit: Payer: Medicare Other | Admitting: Physician Assistant

## 2019-09-11 ENCOUNTER — Other Ambulatory Visit (HOSPITAL_COMMUNITY): Payer: Medicare Other

## 2019-09-20 ENCOUNTER — Other Ambulatory Visit (HOSPITAL_COMMUNITY): Payer: Medicare Other

## 2019-09-24 ENCOUNTER — Other Ambulatory Visit (HOSPITAL_COMMUNITY): Payer: Medicare Other

## 2019-09-27 ENCOUNTER — Encounter (HOSPITAL_COMMUNITY): Payer: Self-pay | Admitting: Interventional Cardiology

## 2019-09-27 ENCOUNTER — Telehealth (HOSPITAL_COMMUNITY): Payer: Self-pay | Admitting: Interventional Cardiology

## 2019-09-27 NOTE — Telephone Encounter (Signed)
I have mailed patient a letter to call and reschedule the Echocardiogram  that Dr. Eldridge Dace wanted him to have on 09/24/19 that he No Showed.   . We have made several attempts to contact this patient including sending a letter to schedule or reschedule their echocardiogram. We will be removing the patient from the echo WQ.   Patient has cancelled below: 08/27/19 cancelled 09/11/2019 Pt cancelled  09/20/19 Pt cancelled  09/24/2019 No Showed.  Thank you

## 2019-10-02 ENCOUNTER — Other Ambulatory Visit: Payer: Self-pay

## 2019-10-02 ENCOUNTER — Ambulatory Visit (HOSPITAL_COMMUNITY): Payer: Medicare Other | Attending: Internal Medicine

## 2019-10-02 DIAGNOSIS — I4891 Unspecified atrial fibrillation: Secondary | ICD-10-CM | POA: Insufficient documentation

## 2019-10-02 DIAGNOSIS — I2510 Atherosclerotic heart disease of native coronary artery without angina pectoris: Secondary | ICD-10-CM | POA: Diagnosis not present

## 2019-10-02 DIAGNOSIS — E7800 Pure hypercholesterolemia, unspecified: Secondary | ICD-10-CM | POA: Insufficient documentation

## 2019-10-02 DIAGNOSIS — I10 Essential (primary) hypertension: Secondary | ICD-10-CM | POA: Diagnosis not present

## 2019-10-02 DIAGNOSIS — F17200 Nicotine dependence, unspecified, uncomplicated: Secondary | ICD-10-CM | POA: Diagnosis not present

## 2019-10-02 DIAGNOSIS — I739 Peripheral vascular disease, unspecified: Secondary | ICD-10-CM | POA: Diagnosis present

## 2019-10-02 LAB — ECHOCARDIOGRAM COMPLETE
Area-P 1/2: 4.48 cm2
S' Lateral: 2.9 cm

## 2019-10-02 NOTE — Progress Notes (Signed)
Cardiology Office Note   Date:  10/04/2019   ID:  Alex Mullins, DOB [birth year], MRN [id number]  PCP:  Wilburn Mylar, MD    No chief complaint on file.  CAD  Wt Readings from Last 3 Encounters:  10/04/19 169 lb (76.7 kg)  08/07/19 171 lb (77.6 kg)  07/31/18 185 lb (83.9 kg)       History of Present Illness: Alex Mullins is a 74 y.o. male   with history of CAD status post DES to the RCA in 2016, tobacco abuse, hypertension, claudication symptoms followed by VVS recommend walking. Is[place]him in4/2018atwhich time he stopped Brilinta and started Plavix 75 mg daily. Smoking cessation recommended.  Rx for Chantix given at that time.He was able to cut back with Chantix, but when he ran out, he started smoking more.   In the past, walking was limited by claudication.  He did have a pedaling device at home that he could use for exercise but admitted to not using it very often.  Atrial fibrillation was first noted in June 2021.  He was started on Eliquis and metoprolol was increased.  He reports some blood in the urine that is visible.  He has not mentioned this to anyone else.  It predates the start of Eliquis- about 8 months ago and has been increasing, even before Eliquis started.  He got his first Moderna shot recently.   Denies : Chest pain. Dizziness. Leg edema. Nitroglycerin use. Orthopnea. Palpitations. Paroxysmal nocturnal dyspnea. Shortness of breath. Syncope.   Not exercising much.    Past Medical History:  Diagnosis Date  . Carotid artery occlusion   . COPD (chronic obstructive pulmonary disease) ([facility])   . Hyperlipidemia   . Hypertension   . RBBB (right bundle branch block)   . Unstable angina ([facility]) 06/04/2014   DES 2 to the RCA  . Vitamin D deficiency     Past Surgical History:  Procedure Laterality Date  . CORONARY STENT PLACEMENT  06/05/2014   3.0 x 38 mm overlapped with a 3.5 x 20 mm Promus DES to the RCA  . HERNIA REPAIR    . INGUINAL HERNIA  REPAIR  2008   right  . LEFT HEART CATHETERIZATION WITH CORONARY ANGIOGRAM N/A 06/05/2014   Procedure: LEFT HEART CATHETERIZATION WITH CORONARY ANGIOGRAM;  Surgeon: Iran Ouch, MD; LAD 20/60/20%, D1 30%, CFX 20%, OM to 20%, RCA 99/80%>>0% w/  3.0 x 38 mm overlapped with a 3.5 x 20 mm Promus DES  . SPINE SURGERY  1982     Current Outpatient Medications  Medication Sig Dispense Refill  . amLODipine (NORVASC) 10 MG tablet Take 1 tablet (10 mg total) by mouth daily. 90 tablet 3  . apixaban (ELIQUIS) 5 MG TABS tablet Take 1 tablet (5 mg total) by mouth 2 (two) times daily. 60 tablet 11  . atorvastatin (LIPITOR) 80 MG tablet Take 1 tablet (80 mg total) by mouth at bedtime. 90 tablet 3  . Cholecalciferol (VITAMIN D PO) Take 1 tablet by mouth daily.    . metoprolol tartrate (LOPRESSOR) 25 MG tablet Take 1 tablet (25 mg total) by mouth 2 (two) times daily. 180 tablet 3  . nitroGLYCERIN (NITROLINGUAL) 0.4 MG/SPRAY spray Place 1 spray under the tongue every 5 (five) minutes x 3 doses as needed for chest pain. 12 g 12  . pantoprazole (PROTONIX) 40 MG tablet TAKE 1 TABLET BY MOUTH  DAILY 90 tablet 3  . varenicline (CHANTIX STARTING MONTH PAK) 0.5 MG X  11 & 1 MG X 42 tablet Take one 0.5 mg tablet by mouth once daily for 3 days, then increase to one 0.5 mg tablet twice daily for 4 days, then increase to one 1 mg tablet twice daily. 53 tablet 0   No current facility-administered medications for this visit.    Allergies:   Patient has no known allergies.    Social History:  The patient  reports that he has been smoking cigarettes. He has a 50.00 pack-year smoking history. He has never used smokeless tobacco. He reports current alcohol use of about 5.0 standard drinks of alcohol per week. He reports that he does not use drugs.   Family History:  The patient's family history includes Cancer (age of onset: 34) in his sister; Heart attack in his father; Heart disease in his father; Hypertension in his  mother.    ROS:  Please see the history of present illness.   Otherwise, review of systems are positive for hematuria.   All other systems are reviewed and negative.    PHYSICAL EXAM: VS:  BP 130/70   Pulse (!) 103   Ht 5\' 11"  (1.803 m)   Wt 169 lb (76.7 kg)   SpO2 95%   BMI 23.57 kg/m  , BMI Body mass index is 23.57 kg/m. GEN: Well nourished, well developed, in no acute distress  HEENT: normal  Neck: no JVD, carotid bruits, or masses Cardiac: irregularly irregular; no murmurs, rubs, or gallops,no edema  Respiratory:  clear to auscultation bilaterally, normal work of breathing GI: soft, nontender, nondistended, + BS MS: no deformity or atrophy  Skin: warm and dry, no rash Neuro:  Slow gait Psych: euthymic mood, flat affect   EKG:   The ekg ordered today demonstrates AFib, borderine rate controlled, RBBB   Recent Labs: 08/07/2019: ALT 16; BUN 14; Creatinine, Ser 1.29; Hemoglobin 14.3; Platelets 247; Potassium 4.0; Sodium 140; TSH 2.300   Lipid Panel    Component Value Date/Time   CHOL 166 08/07/2019 1017   TRIG 98 08/07/2019 1017   HDL 53 08/07/2019 1017   CHOLHDL 3.1 08/07/2019 1017   CHOLHDL 3.2 07/08/2015 0852   VLDL 41 (H) 07/08/2015 0852   LDLCALC 95 08/07/2019 1017     Other studies Reviewed: Additional studies/ records that were reviewed today with results demonstrating: echo reviewed.   ASSESSMENT AND PLAN:  1. CAD: Continue aggressive secondary prevention.  No angina.  2. Atrial fibrillation: This was first noted in June 2021.  He was started on Eliquis and metoprolol was increased.  He does report some blood in urine over the past few months.  3. Hyperlipidemia: The current medical regimen is effective;  continue present plan and medications. 4. Hypertension: The current medical regimen is effective;  continue present plan and medications. 5. Tobacco abuse: He needs to stop smoking.  He smokes about < 1 ppd.  6. PAD: He should try to walk more and  improve circulation.  Watch for any nonhealing sores. 7. Anticoagulated: Some hematuria which predates the Eliquis.  Check U/A and CBC.    Current medicines are reviewed at length with the patient today.  The patient concerns regarding his medicines were addressed.  The following changes have been made:  No change  Labs/ tests ordered today include: 6 months No orders of the defined types were placed in this encounter.   Recommend 150 minutes/week of aerobic exercise Low fat, low carb, high fiber diet recommended  Disposition:   FU in 6 months  Signed, Lance Muss, MD  10/04/2019 8:20 AM    [facility] [address] [place], [place], [place]  [address] Phone: [phone]; Fax: [phone]

## 2019-10-04 ENCOUNTER — Other Ambulatory Visit: Payer: Self-pay

## 2019-10-04 ENCOUNTER — Ambulatory Visit: Payer: Medicare Other | Admitting: Interventional Cardiology

## 2019-10-04 ENCOUNTER — Encounter: Payer: Self-pay | Admitting: Interventional Cardiology

## 2019-10-04 VITALS — BP 130/70 | HR 103 | Ht 71.0 in | Wt 169.0 lb

## 2019-10-04 DIAGNOSIS — I2510 Atherosclerotic heart disease of native coronary artery without angina pectoris: Secondary | ICD-10-CM | POA: Diagnosis not present

## 2019-10-04 DIAGNOSIS — F17200 Nicotine dependence, unspecified, uncomplicated: Secondary | ICD-10-CM

## 2019-10-04 DIAGNOSIS — E7800 Pure hypercholesterolemia, unspecified: Secondary | ICD-10-CM | POA: Diagnosis not present

## 2019-10-04 DIAGNOSIS — I10 Essential (primary) hypertension: Secondary | ICD-10-CM

## 2019-10-04 DIAGNOSIS — R319 Hematuria, unspecified: Secondary | ICD-10-CM

## 2019-10-04 DIAGNOSIS — I739 Peripheral vascular disease, unspecified: Secondary | ICD-10-CM

## 2019-10-04 LAB — CBC
Hematocrit: 40 % (ref 37.5–51.0)
Hemoglobin: 13.7 g/dL (ref 13.0–17.7)
MCH: 33.2 pg — ABNORMAL HIGH (ref 26.6–33.0)
MCHC: 34.3 g/dL (ref 31.5–35.7)
MCV: 97 fL (ref 79–97)
Platelets: 219 10*3/uL (ref 150–450)
RBC: 4.13 x10E6/uL — ABNORMAL LOW (ref 4.14–5.80)
RDW: 13 % (ref 11.6–15.4)
WBC: 5.9 10*3/uL (ref 3.4–10.8)

## 2019-10-04 LAB — BASIC METABOLIC PANEL
BUN/Creatinine Ratio: 9 — ABNORMAL LOW (ref 10–24)
BUN: 11 mg/dL (ref 8–27)
CO2: 26 mmol/L (ref 20–29)
Calcium: 8.8 mg/dL (ref 8.6–10.2)
Chloride: 100 mmol/L (ref 96–106)
Creatinine, Ser: 1.26 mg/dL (ref 0.76–1.27)
GFR calc Af Amer: 64 mL/min/{1.73_m2} (ref >59)
GFR calc non Af Amer: 55 mL/min/{1.73_m2} — ABNORMAL LOW (ref >59)
Glucose: 105 mg/dL — ABNORMAL HIGH (ref 65–99)
Potassium: 3.8 mmol/L (ref 3.5–5.2)
Sodium: 142 mmol/L (ref 134–144)

## 2019-10-04 LAB — URINALYSIS
Nitrite, UA: POSITIVE — AB
Specific Gravity, UA: 1.021 (ref 1.005–1.030)

## 2019-10-04 NOTE — Patient Instructions (Signed)
Medication Instructions:  Your physician recommends that you continue on your current medications as directed. Please refer to the Current Medication list given to you today.  *If you need a refill on your cardiac medications before your next appointment, please call your pharmacy*   Lab Work: TODAY: CBC, BMET, Urinalysis  If you have labs (blood work) drawn today and your tests are completely normal, you will receive your results only by: Marland Kitchen MyChart Message (if you have MyChart) OR . A paper copy in the mail If you have any lab test that is abnormal or we need to change your treatment, we will call you to review the results.   Testing/Procedures: None   Follow-Up: At [facility], you and your health needs are our priority.  As part of our continuing mission to provide you with exceptional heart care, we have created designated Provider Care Teams.  These Care Teams include your primary Cardiologist (physician) and Advanced Practice Providers (APPs -  Physician Assistants and Nurse Practitioners) who all work together to provide you with the care you need, when you need it.  We recommend signing up for the patient portal called "MyChart".  Sign up information is provided on this After Visit Summary.  MyChart is used to connect with patients for Virtual Visits (Telemedicine).  Patients are able to view lab/test results, encounter notes, upcoming appointments, etc.  Non-urgent messages can be sent to your provider as well.   To learn more about what you can do with MyChart, go to [email or web address].    Your next appointment:   6 month(s)  The format for your next appointment:   In Person  Provider:   You may see Lance Muss, MD or one of the following Advanced Practice Providers on your designated Care Team:    Ronie Spies, PA-C  Jacolyn Reedy, PA-C    Other Instructions None

## 2019-10-16 ENCOUNTER — Telehealth: Payer: Self-pay | Admitting: Interventional Cardiology

## 2019-10-16 NOTE — Telephone Encounter (Signed)
New message:    Patient daughter calling concerning some results for her father. Please call back today.

## 2019-10-16 NOTE — Telephone Encounter (Signed)
Patient's daughter calling about CBC/BMET/Urinalysis results from 10/04/19. It appears these results were faxed to patient's PCP for review and advisement. Per patient's daughter Dr. Tresa Endo (the patient's PCP) has retired and they will not advise on results.   I have reviewed CBC/BMET with the patient's daughter. The Urinalysis did not show if the patient had blood in the urine or not. That was the whole purpose of [place] ordering it.   Discussed with Dr. Eldridge Dace. Patient will continue on Eliquis since CBC stable. No need to repeat urinalysis at this time. Patient to re-estblish with PCP. Called and made daughter aware. She verbalized understanding and thanked me for the call.

## 2020-06-11 ENCOUNTER — Other Ambulatory Visit: Payer: Self-pay | Admitting: Interventional Cardiology

## 2020-07-02 ENCOUNTER — Other Ambulatory Visit: Payer: Self-pay | Admitting: Interventional Cardiology

## 2020-07-23 NOTE — Progress Notes (Signed)
Cardiology Office Note   Date:  07/25/2020   ID:  Alex Mullins, DOB 13-Sep-1945, MRN [id number]  PCP:  Wilburn Mylar, MD    No chief complaint on file.  CAD/AFib  Wt Readings from Last 3 Encounters:  07/25/20 154 lb (69.9 kg)  10/04/19 169 lb (76.7 kg)  08/07/19 171 lb (77.6 kg)       History of Present Illness: Alex Mullins is a 75 y.o. male    with history of CAD status post DES to the RCA in 2016, tobacco abuse, hypertension, claudication symptoms followed by [facility] recommend walking. I saw him in4/2018 at which time he stopped Brilinta and started Plavix 75 mg daily.  Smoking cessation recommended.  Rx for Chantix given at that time. He was able to cut back with Chantix, but when he ran out, he started smoking more.   In the past, walking was limited by claudication.  He did have a pedaling device at home that he could use for exercise but admitted to not using it very often.   Atrial fibrillation was first noted in June 2021.  He was started on Eliquis and metoprolol was increased.   In 2021, "He reports some blood in the urine that is visible.  He has not mentioned this to anyone else.  It predates the start of Eliquis- about 8 months ago and has been increasing, even before Eliquis started. "  He never saw a urologist.  He reports leg weakness and falling when he tries to stand.  He feels his balance is off.  No syncope. He remembers whenn he is falling but cannot catch himself.    He has lost 30 lbs in past 2 years.  No appetite.        Past Medical History:  Diagnosis Date   Carotid artery occlusion    COPD (chronic obstructive pulmonary disease) (HCC)    Hyperlipidemia    Hypertension    RBBB (right bundle branch block)    Unstable angina (HCC) 06/04/2014   DES 2 to the RCA   Vitamin D deficiency     Past Surgical History:  Procedure Laterality Date   CORONARY STENT PLACEMENT  06/05/2014   3.0 x 38 mm overlapped with a 3.5 x 20 mm Promus DES  to the RCA   HERNIA REPAIR     INGUINAL HERNIA REPAIR  2008   right   LEFT HEART CATHETERIZATION WITH CORONARY ANGIOGRAM N/A 06/05/2014   Procedure: LEFT HEART CATHETERIZATION WITH CORONARY ANGIOGRAM;  Surgeon: Iran Ouch, MD; LAD 20/60/20%, D1 30%, CFX 20%, OM to 20%, RCA 99/80%>>0% w/  3.0 x 38 mm overlapped with a 3.5 x 20 mm Promus DES   SPINE SURGERY  1982     Current Outpatient Medications  Medication Sig Dispense Refill   amLODipine (NORVASC) 10 MG tablet Take 1 tablet (10 mg total) by mouth daily. Please keep upcoming appointment in June 2022 for future refills. Thank you 90 tablet 0   apixaban (ELIQUIS) 5 MG TABS tablet Take 1 tablet (5 mg total) by mouth 2 (two) times daily. 60 tablet 11   atorvastatin (LIPITOR) 80 MG tablet TAKE 1 TABLET BY MOUTH AT  BEDTIME 90 tablet 0   Cholecalciferol (VITAMIN D PO) Take 1 tablet by mouth daily.     metoprolol tartrate (LOPRESSOR) 25 MG tablet TAKE 1 TABLET BY MOUTH  TWICE DAILY 180 tablet 0   nitroGLYCERIN (NITROLINGUAL) 0.4 MG/SPRAY spray Place 1 spray under the tongue  every 5 (five) minutes x 3 doses as needed for chest pain. 12 g 12   pantoprazole (PROTONIX) 40 MG tablet TAKE 1 TABLET BY MOUTH  DAILY 90 tablet 3   varenicline (CHANTIX STARTING MONTH PAK) 0.5 MG X 11 & 1 MG X 42 tablet Take one 0.5 mg tablet by mouth once daily for 3 days, then increase to one 0.5 mg tablet twice daily for 4 days, then increase to one 1 mg tablet twice daily. 53 tablet 0   No current facility-administered medications for this visit.    Allergies:   Patient has no known allergies.    Social History:  The patient  reports that he has been smoking cigarettes. He has a 50.00 pack-year smoking history. He has never used smokeless tobacco. He reports current alcohol use of about 5.0 standard drinks of alcohol per week. He reports that he does not use drugs.   Family History:  The patient's family history includes Cancer (age of onset: 22) in his sister;  Heart attack in his father; Heart disease in his father; Hypertension in his mother.    ROS:  Please see the history of present illness.   Otherwise, review of systems are positive for leg weakness, falling.   All other systems are reviewed and negative.    PHYSICAL EXAM: VS:  BP (!) 112/58   Pulse (!) 112   Ht 5\' 11"  (1.803 m)   Wt 154 lb (69.9 kg)   SpO2 96%   BMI 21.48 kg/m  , BMI Body mass index is 21.48 kg/m. GEN: Frail, in no acute distress HEENT: normal Neck: no JVD, carotid bruits, or masses Cardiac: Irregularly irregular; no murmurs, rubs, or gallops,no edema  Respiratory:  clear to auscultation bilaterally, normal work of breathing GI: soft, nontender, nondistended, + BS MS: no deformity or atrophy Skin: warm and dry, no rash Neuro:  Strength and sensation are intact Psych: euthymic mood, flat affect   EKG:   The ekg ordered today demonstrates atrial fibrillation, right bundle branch block pattern, rapid ventricular response   Recent Labs: 08/07/2019: ALT 16; TSH 2.300 10/04/2019: BUN 11; Creatinine, Ser 1.26; Hemoglobin 13.7; Platelets 219; Potassium 3.8; Sodium 142   Lipid Panel    Component Value Date/Time   CHOL 166 08/07/2019 1017   TRIG 98 08/07/2019 1017   HDL 53 08/07/2019 1017   CHOLHDL 3.1 08/07/2019 1017   CHOLHDL 3.2 07/08/2015 0852   VLDL 41 (H) 07/08/2015 0852   LDLCALC 95 08/07/2019 1017     Other studies Reviewed: Additional studies/ records that were reviewed today with results demonstrating: 2021 labs reviewed..   ASSESSMENT AND PLAN:  CAD: No angina.  Activity limited by balance.  Continue aggressive secondary prevention.  AFib: Has been on Eliquis for stroke prevention.  Did have hematuria in the past.  HR higher now.  However, given his orthostatic symptoms, I am hesitant to increase metoprolol.  I encouraged him to try to stay better hydrated.  His appetite is quite poor and he has been losing weight.  Hydration may help with heart  rate and with reducing the orthostatic symptoms.  We will check urine for blood and specific gravity as well Hyperlipidemia: LDL 95 in June 2021.  Continue atorvastatin.   HTN: The current medical regimen is effective;  continue present plan and medications. Tobacco abuse: He needs to stop smoking. Unable to quit.  1 ppd.  Chantix helped but he did not quit.  PAD: Increase walking to help circulation.  We discussed HHPT but he declined.  Anticoagulated: Continue to monitor for bleeding issues.  COntinues to have blood in urine.  May need to send to urology if he has persistent blood in the urine. Needs to find a new PMD.  List was given to him.   Current medicines are reviewed at length with the patient today.  The patient concerns regarding his medicines were addressed.  The following changes have been made:  No change  Labs/ tests ordered today include:  No orders of the defined types were placed in this encounter.   Recommend 150 minutes/week of aerobic exercise Low fat, low carb, high fiber diet recommended  Disposition:   FU in 6 months   Signed, Lance Muss, MD  07/25/2020 9:14 AM    [facility] [address] [place], [place], [place]  [address] Phone: [phone]; Fax: [phone]

## 2020-07-25 ENCOUNTER — Encounter: Payer: Self-pay | Admitting: Interventional Cardiology

## 2020-07-25 ENCOUNTER — Other Ambulatory Visit: Payer: Self-pay | Admitting: *Deleted

## 2020-07-25 ENCOUNTER — Ambulatory Visit: Payer: Medicare Other | Admitting: Interventional Cardiology

## 2020-07-25 ENCOUNTER — Other Ambulatory Visit: Payer: Self-pay

## 2020-07-25 VITALS — BP 112/58 | HR 112 | Ht 71.0 in | Wt 154.0 lb

## 2020-07-25 DIAGNOSIS — R319 Hematuria, unspecified: Secondary | ICD-10-CM | POA: Diagnosis not present

## 2020-07-25 DIAGNOSIS — I2510 Atherosclerotic heart disease of native coronary artery without angina pectoris: Secondary | ICD-10-CM | POA: Diagnosis not present

## 2020-07-25 DIAGNOSIS — E785 Hyperlipidemia, unspecified: Secondary | ICD-10-CM | POA: Diagnosis not present

## 2020-07-25 DIAGNOSIS — I10 Essential (primary) hypertension: Secondary | ICD-10-CM

## 2020-07-25 LAB — COMPREHENSIVE METABOLIC PANEL
ALT: 24 IU/L (ref 0–44)
AST: 30 IU/L (ref 0–40)
Albumin/Globulin Ratio: 0.8 — ABNORMAL LOW (ref 1.2–2.2)
Albumin: 2.6 g/dL — ABNORMAL LOW (ref 3.7–4.7)
Alkaline Phosphatase: 197 IU/L — ABNORMAL HIGH (ref 44–121)
BUN/Creatinine Ratio: 10 (ref 10–24)
BUN: 11 mg/dL (ref 8–27)
Bilirubin Total: 1.1 mg/dL (ref 0.0–1.2)
CO2: 23 mmol/L (ref 20–29)
Calcium: 7.9 mg/dL — ABNORMAL LOW (ref 8.6–10.2)
Chloride: 99 mmol/L (ref 96–106)
Creatinine, Ser: 1.14 mg/dL (ref 0.76–1.27)
Globulin, Total: 3.1 g/dL (ref 1.5–4.5)
Glucose: 115 mg/dL — ABNORMAL HIGH (ref 65–99)
Potassium: 4.2 mmol/L (ref 3.5–5.2)
Sodium: 139 mmol/L (ref 134–144)
Total Protein: 5.7 g/dL — ABNORMAL LOW (ref 6.0–8.5)
eGFR: 67 mL/min/{1.73_m2} (ref >59)

## 2020-07-25 LAB — LIPID PANEL
Chol/HDL Ratio: 2.3 ratio (ref 0.0–5.0)
Cholesterol, Total: 128 mg/dL (ref 100–199)
HDL: 56 mg/dL (ref >39)
LDL Chol Calc (NIH): 55 mg/dL (ref 0–99)
Triglycerides: 90 mg/dL (ref 0–149)
VLDL Cholesterol Cal: 17 mg/dL (ref 5–40)

## 2020-07-25 LAB — TSH: TSH: 2.32 u[IU]/mL (ref 0.450–4.500)

## 2020-07-25 LAB — CBC
Hematocrit: 37.3 % — ABNORMAL LOW (ref 37.5–51.0)
Hemoglobin: 12.6 g/dL — ABNORMAL LOW (ref 13.0–17.7)
MCH: 32.8 pg (ref 26.6–33.0)
MCHC: 33.8 g/dL (ref 31.5–35.7)
MCV: 97 fL (ref 79–97)
Platelets: 192 10*3/uL (ref 150–450)
RBC: 3.84 x10E6/uL — ABNORMAL LOW (ref 4.14–5.80)
RDW: 13.8 % (ref 11.6–15.4)
WBC: 5 10*3/uL (ref 3.4–10.8)

## 2020-07-25 NOTE — Patient Instructions (Addendum)
Medication Instructions:  Your physician recommends that you continue on your current medications as directed. Please refer to the Current Medication list given to you today.  *If you need a refill on your cardiac medications before your next appointment, please call your pharmacy*   Lab Work: Lab work to be done today--CMET, CBC, lipids, TSH, UA If you have labs (blood work) drawn today and your tests are completely normal, you will receive your results only by: MyChart Message (if you have MyChart) OR A paper copy in the mail If you have any lab test that is abnormal or we need to change your treatment, we will call you to review the results.   Testing/Procedures: none   Follow-Up: At [facility], you and your health needs are our priority.  As part of our continuing mission to provide you with exceptional heart care, we have created designated Provider Care Teams.  These Care Teams include your primary Cardiologist (physician) and Advanced Practice Providers (APPs -  Physician Assistants and Nurse Practitioners) who all work together to provide you with the care you need, when you need it.  We recommend signing up for the patient portal called "MyChart".  Sign up information is provided on this After Visit Summary.  MyChart is used to connect with patients for Virtual Visits (Telemedicine).  Patients are able to view lab/test results, encounter notes, upcoming appointments, etc.  Non-urgent messages can be sent to your provider as well.   To learn more about what you can do with MyChart, go to [email or web address].    Your next appointment:   6 month(s)--December 12,2022 at 8:00  The format for your next appointment:   In Person  Provider:   Everette Rank, MD   Other Instructions  Make sure you stay well hydrated throughout the day  You were given list of primary care providers today.  Can also call [phone] for assistance finding a primary care provider.

## 2020-07-28 ENCOUNTER — Telehealth: Payer: Self-pay | Admitting: Interventional Cardiology

## 2020-07-28 NOTE — Telephone Encounter (Signed)
Spoke with the patient's daughter and gave her results of lab work.

## 2020-07-28 NOTE — Telephone Encounter (Signed)
Pt was seen by Dr. Eldridge Dace on 07/25/20 and had lab work, Daughter Lockie Pares would like to have the results faxed to [phone]

## 2020-07-31 LAB — MICROSCOPIC EXAMINATION

## 2020-07-31 LAB — URINALYSIS, ROUTINE W REFLEX MICROSCOPIC
Bilirubin, UA: NEGATIVE
Glucose, UA: NEGATIVE
Ketones, UA: NEGATIVE
Nitrite, UA: NEGATIVE
Specific Gravity, UA: 1.008 (ref 1.005–1.030)
Urobilinogen, Ur: 1 mg/dL (ref 0.2–1.0)
pH, UA: 8.5 — ABNORMAL HIGH (ref 5.0–7.5)

## 2020-08-01 ENCOUNTER — Telehealth: Payer: Self-pay | Admitting: *Deleted

## 2020-08-01 DIAGNOSIS — R319 Hematuria, unspecified: Secondary | ICD-10-CM

## 2020-08-01 DIAGNOSIS — R82998 Other abnormal findings in urine: Secondary | ICD-10-CM

## 2020-08-01 NOTE — Telephone Encounter (Signed)
-----   Message from Corky Crafts, MD sent at 07/31/2020  5:07 PM EDT ----- Has some evidence of calcium oxalate crystals and blood in urine.  COncerning for kidney stones.  If he has not yet found a PCP, can refer to [facility].

## 2020-08-01 NOTE — Telephone Encounter (Signed)
No answer on patient's home number.  I spoke with patient's daughter and reviewed urinalysis and lab results from 6/10 with her.  Patient does not have PCP yet.  Daughter agreeable to [facility] referral.

## 2020-08-27 ENCOUNTER — Telehealth: Payer: Self-pay | Admitting: Interventional Cardiology

## 2020-08-27 NOTE — Telephone Encounter (Signed)
Pt c/o medication issue:  1. Name of Medication:  amLODipine (NORVASC) 10 MG tablet metoprolol tartrate (LOPRESSOR) 25 MG tablet atorvastatin (LIPITOR) 80 MG tablet  2. How are you currently taking this medication (dosage and times per day)? Amlodipine 1 tablet daily, metoprolol 1 tablet twice a day, atorvastatin 1 tablet daily  3. Are you having a reaction (difficulty breathing--STAT)? no  4. What is your medication issue? Patient's daughter states the patient has a new PCP, Betsey Holiday at [facility] Phone: [phone]. She states, due to lab work his PCP wants to cut the amlodipine in half to equal 5 mg daily, stop his evening metoprolol dose, or cut atorvastatin in half to equal 40 mg daily. She states they would like a call back to discuss changing his medication. Patient's daughter would also like a call if any of the medications are changed.

## 2020-08-27 NOTE — Telephone Encounter (Signed)
Spoke with daughter, DPR on file.  Pt seen new PCP at the end of June and they advised for her to reach out to our office to see if pt needed some medication adjustments.  Recent LDL in our office was 55, so PCP recommended seeing if Dr. Eldridge Dace felt it would be appropriate to decrease Atorvastatin to 40mg  QD?  They also did orthostatic BPs on pt and when lying it was 120s/70s and dropped to 90/56 when he sat up and he became lightheaded.  PCP told daughter to ask about decreasing dose of Metoprolol or Amlodipine. Advised I will send to Dr. for review and advisement.

## 2020-08-28 NOTE — Telephone Encounter (Signed)
Advised patients daughter with recommendations from Dr. Eldridge Dace.   Corky Crafts, MD    9:30 AM Keep statin dose the same.     I addressed his metoprolol in June.  HR was high, but due to orthostasis, we kept the dose the same.  He needs to eat and drink more.  He was losing weight.  We need HR info before changing metoprolol dose.     JV    She is asked to take BP and HR twice daily and call with readings on Monday for further advisement on metoprolol dose.  Verbalized understanding.

## 2020-09-03 ENCOUNTER — Other Ambulatory Visit: Payer: Self-pay | Admitting: Interventional Cardiology

## 2020-09-23 ENCOUNTER — Encounter: Payer: Self-pay | Admitting: Interventional Cardiology

## 2020-09-23 ENCOUNTER — Other Ambulatory Visit: Payer: Self-pay | Admitting: Interventional Cardiology

## 2020-10-13 ENCOUNTER — Other Ambulatory Visit: Payer: Self-pay | Admitting: *Deleted

## 2020-10-13 ENCOUNTER — Telehealth: Payer: Self-pay | Admitting: Interventional Cardiology

## 2020-10-13 MED ORDER — PANTOPRAZOLE SODIUM 40 MG PO TBEC: DELAYED_RELEASE_TABLET | ORAL | 3 refills | Status: AC

## 2020-10-13 NOTE — Telephone Encounter (Signed)
Refill sent into [facility] per request.

## 2020-10-13 NOTE — Telephone Encounter (Signed)
*  STAT* If patient is at the pharmacy, call can be transferred to refill team.   1. Which medications need to be refilled? (please list name of each medication and dose if known)   pantoprazole (PROTONIX) 40 MG tablet    2. Which pharmacy/location (including street and city if local pharmacy) is medication to be sent to? [facility]  [facility] - [place], [place] - [address] [place]  3. Do they need a 30 day or 90 day supply? 90 day supply   Pt is out of medication

## 2020-10-28 ENCOUNTER — Telehealth: Payer: Self-pay | Admitting: Interventional Cardiology

## 2020-10-28 NOTE — Telephone Encounter (Signed)
   Pt's daughter calling, she said pt is in the the hospital in [place] and would like to speak with Dr. Eldridge Dace about the changes in the pt's medications

## 2020-10-28 NOTE — Telephone Encounter (Signed)
Spoke with pt's daughter, Inetta Fermo, [place] requesting Dr Eldridge Dace review hospital notes for pt's current admission.  Advised Dr Eldridge Dace and his RN are out of the office today but will forward to Dr Eldridge Dace for review.  Pt's daugter verbalizes understanding and thanked [employer] fundraiser for the phone call.

## 2020-11-04 NOTE — Telephone Encounter (Signed)
Patient has hospital follow up scheduled with Brunetta Genera, NP on 10/21

## 2020-11-15 DEATH — deceased

## 2020-12-05 ENCOUNTER — Ambulatory Visit (HOSPITAL_BASED_OUTPATIENT_CLINIC_OR_DEPARTMENT_OTHER): Payer: Medicare Other | Admitting: Family

## 2021-01-26 ENCOUNTER — Ambulatory Visit: Payer: Medicare Other | Admitting: Interventional Cardiology
